# Patient Record
Sex: Male | Born: 1940 | Race: White | Hispanic: No | Marital: Married | Smoking: Never smoker
Health system: Southern US, Community
[De-identification: ages and names within clinical notes are randomized; demographics above are authoritative.]

## PROBLEM LIST (undated history)

## (undated) DIAGNOSIS — C801 Malignant (primary) neoplasm, unspecified: Secondary | ICD-10-CM

## (undated) DIAGNOSIS — N200 Calculus of kidney: Secondary | ICD-10-CM

## (undated) DIAGNOSIS — M1990 Unspecified osteoarthritis, unspecified site: Secondary | ICD-10-CM

## (undated) DIAGNOSIS — I10 Essential (primary) hypertension: Secondary | ICD-10-CM

## (undated) DIAGNOSIS — E119 Type 2 diabetes mellitus without complications: Secondary | ICD-10-CM

## (undated) DIAGNOSIS — I219 Acute myocardial infarction, unspecified: Secondary | ICD-10-CM

## (undated) DIAGNOSIS — G4730 Sleep apnea, unspecified: Secondary | ICD-10-CM

## (undated) HISTORY — DX: Sleep apnea, unspecified: G47.30

## (undated) HISTORY — DX: Unspecified osteoarthritis, unspecified site: M19.90

## (undated) HISTORY — DX: Malignant (primary) neoplasm, unspecified: C80.1

## (undated) HISTORY — DX: Essential (primary) hypertension: I10

## (undated) HISTORY — DX: Calculus of kidney: N20.0

## (undated) HISTORY — DX: Type 2 diabetes mellitus without complications: E11.9

## (undated) HISTORY — PX: CARDIAC CATHETERIZATION: SHX172

## (undated) HISTORY — PX: TUMOR EXCISION: SHX421

## (undated) HISTORY — DX: Acute myocardial infarction, unspecified: I21.9

## (undated) HISTORY — PX: TONSILLECTOMY AND ADENOIDECTOMY: SUR1326

---

## 2008-02-27 ENCOUNTER — Ambulatory Visit (HOSPITAL_COMMUNITY): Admission: RE | Admit: 2008-02-27 | Discharge: 2008-02-27 | Payer: Self-pay | Admitting: Internal Medicine

## 2008-03-29 ENCOUNTER — Ambulatory Visit (HOSPITAL_BASED_OUTPATIENT_CLINIC_OR_DEPARTMENT_OTHER): Admission: RE | Admit: 2008-03-29 | Discharge: 2008-03-29 | Payer: Self-pay | Admitting: Internal Medicine

## 2008-03-29 ENCOUNTER — Encounter: Payer: Self-pay | Admitting: Internal Medicine

## 2008-03-30 ENCOUNTER — Ambulatory Visit: Payer: Self-pay | Admitting: Internal Medicine

## 2008-04-22 ENCOUNTER — Ambulatory Visit: Payer: Self-pay | Admitting: Internal Medicine

## 2008-04-22 DIAGNOSIS — G4733 Obstructive sleep apnea (adult) (pediatric): Secondary | ICD-10-CM | POA: Insufficient documentation

## 2008-06-14 ENCOUNTER — Ambulatory Visit: Payer: Self-pay | Admitting: Internal Medicine

## 2009-01-07 ENCOUNTER — Ambulatory Visit: Payer: Self-pay | Admitting: Internal Medicine

## 2009-01-08 ENCOUNTER — Telehealth (INDEPENDENT_AMBULATORY_CARE_PROVIDER_SITE_OTHER): Payer: Self-pay | Admitting: *Deleted

## 2009-05-05 ENCOUNTER — Encounter: Payer: Self-pay | Admitting: Internal Medicine

## 2009-08-02 ENCOUNTER — Encounter: Admission: RE | Admit: 2009-08-02 | Discharge: 2009-08-02 | Payer: Self-pay | Admitting: Sports Medicine

## 2009-08-07 ENCOUNTER — Encounter: Admission: RE | Admit: 2009-08-07 | Discharge: 2009-08-07 | Payer: Self-pay | Admitting: Sports Medicine

## 2009-10-18 ENCOUNTER — Encounter: Admission: RE | Admit: 2009-10-18 | Discharge: 2009-10-18 | Payer: Self-pay | Admitting: Orthopedic Surgery

## 2009-11-05 ENCOUNTER — Encounter: Admission: RE | Admit: 2009-11-05 | Discharge: 2009-11-05 | Payer: Self-pay | Admitting: Sports Medicine

## 2009-12-08 ENCOUNTER — Encounter: Admission: RE | Admit: 2009-12-08 | Discharge: 2009-12-08 | Payer: Self-pay | Admitting: Sports Medicine

## 2010-02-08 ENCOUNTER — Encounter: Payer: Self-pay | Admitting: Sports Medicine

## 2010-02-17 NOTE — Letter (Signed)
Summary: CMN for CPAP/BIPAP / [facility]  CMN for CPAP/BIPAP / [facility]   Imported By: Lennie Odor 05/12/2009 11:03:17  _____________________________________________________________________  External Attachment:    Type:   Image     Comment:   External Document

## 2010-03-05 ENCOUNTER — Ambulatory Visit (HOSPITAL_COMMUNITY)
Admission: RE | Admit: 2010-03-05 | Discharge: 2010-03-05 | Disposition: A | Discharge status: Home / Self Care | Payer: Medicare Other | Source: Ambulatory Visit | Attending: Internal Medicine | Admitting: Internal Medicine

## 2010-03-05 ENCOUNTER — Other Ambulatory Visit: Payer: Self-pay | Admitting: Internal Medicine

## 2010-03-05 DIAGNOSIS — R1011 Right upper quadrant pain: Secondary | ICD-10-CM

## 2010-03-05 DIAGNOSIS — R52 Pain, unspecified: Secondary | ICD-10-CM

## 2010-03-05 DIAGNOSIS — K7689 Other specified diseases of liver: Secondary | ICD-10-CM | POA: Insufficient documentation

## 2010-03-05 DIAGNOSIS — R079 Chest pain, unspecified: Secondary | ICD-10-CM | POA: Insufficient documentation

## 2010-03-05 DIAGNOSIS — R110 Nausea: Secondary | ICD-10-CM

## 2010-03-05 DIAGNOSIS — R939 Diagnostic imaging inconclusive due to excess body fat of patient: Secondary | ICD-10-CM | POA: Insufficient documentation

## 2010-03-05 DIAGNOSIS — R109 Unspecified abdominal pain: Secondary | ICD-10-CM | POA: Insufficient documentation

## 2010-03-06 ENCOUNTER — Ambulatory Visit (HOSPITAL_COMMUNITY)
Admission: RE | Admit: 2010-03-06 | Discharge: 2010-03-06 | Disposition: A | Discharge status: Home / Self Care | Payer: Medicare Other | Source: Ambulatory Visit | Attending: Internal Medicine | Admitting: Internal Medicine

## 2010-03-06 DIAGNOSIS — R52 Pain, unspecified: Secondary | ICD-10-CM

## 2010-06-02 NOTE — Procedures (Signed)
[name], Cameron Carter               ACCOUNT NO.:  [id number]   MEDICAL RECORD NO.:  [id number]          PATIENT TYPE:  OUT   LOCATION:  SLEEP CENTER                 FACILITY:  [facility]   PHYSICIAN:  Clinton D. Maple Hudson, MD, FCCP, FACPDATE OF BIRTH:  10/[birth year]/41   DATE OF STUDY:  03/29/2008                            NOCTURNAL POLYSOMNOGRAM   REFERRING PHYSICIAN:  Margaretmary Bayley, M.D.   REFERRING PHYSICIAN:  Margaretmary Bayley, MD   INDICATION FOR STUDY:  Hypersomnia with sleep apnea.   EPWORTH SLEEPINESS SCORE:  9/24, BMI 40.4, weight 290 pounds, height 71  inches.  Neck 18 inches.   MEDICATIONS:  Home medications are charted and reviewed.   SLEEP ARCHITECTURE:  Split study protocol.  During the diagnostic phase,  total sleep time was 119.5 minutes with sleep efficiency 84.5%.  Stage I  was 7.9%, stage II 92.1%, stages III and REM were absent.  Sleep latency  5 minutes, awake after sleep onset 17 minutes, arousal index 70.8  indicating increased EEG arousal.  No bedtime medication was taken.   RESPIRATORY DATA:  Split study protocol.  Apnea/hypopnea index (AHI)  102.9 per hour.  A total of 205 events were scored including 53  obstructive apneas and 152 hypopneas.  Events were non positional.  CPAP  was then titrated to 16 CWP, AHI 84 per hour.  The technician then  converted to bilevel PAP and titrated to inspiratory pressure 21,  expiratory pressure 17 CWP for a AHI 0 per hour.  He wore a medium  ResMed Mirage Quattro mask with heated humidifier.   OXYGEN DATA:  Moderate snoring before CPAP with oxygen desaturation to a  nadir of 73%.  After bilevel PAP control, oxygen saturation held 90.4%  on room air.   CARDIAC DATA:  Sinus rhythm.   MOVEMENT-PARASOMNIA:  No significant movement disturbance.  Bathroom x1.   IMPRESSIONS-RECOMMENDATIONS:  1. Severe obstructive sleep apnea/hypopnea syndrome, apnea/hypopnea      index 102.9 per hour.  Most events were recorded while nonsupine.  Moderate-to-loud snoring with oxygen desaturation to a nadir of      73%.  2. Continuous positive airway pressure titration provided inadequate      control at 16 centimeters of water pressure with residual      apnea/hypopnea index of 85.7 per hour.  Successful control required      a bilevel positive airway      pressure (BiPAP), inspiratory 21/expiratory 17 centimeters of water      pressure, apnea/hypopnea index 0 per hour.  He wore a medium ResMed      Mirage Quattro mask with heated humidifier.      Clinton D. Maple Hudson, MD, [facility], FACP  Diplomate, Biomedical engineer of Sleep Medicine  Electronically Signed     CDY/MEDQ  D:  03/31/2008 12:00:37  T:  04/01/2008 03:51:35  Job:  [id number]

## 2010-10-14 DIAGNOSIS — E119 Type 2 diabetes mellitus without complications: Secondary | ICD-10-CM | POA: Insufficient documentation

## 2010-10-14 DIAGNOSIS — I2510 Atherosclerotic heart disease of native coronary artery without angina pectoris: Secondary | ICD-10-CM | POA: Insufficient documentation

## 2010-10-14 DIAGNOSIS — E785 Hyperlipidemia, unspecified: Secondary | ICD-10-CM | POA: Insufficient documentation

## 2010-10-14 DIAGNOSIS — I10 Essential (primary) hypertension: Secondary | ICD-10-CM | POA: Insufficient documentation

## 2010-12-07 ENCOUNTER — Other Ambulatory Visit: Payer: Self-pay | Admitting: Orthopedic Surgery

## 2010-12-07 DIAGNOSIS — M48061 Spinal stenosis, lumbar region without neurogenic claudication: Secondary | ICD-10-CM

## 2010-12-17 ENCOUNTER — Other Ambulatory Visit: Payer: Medicare Other

## 2011-01-19 HISTORY — PX: SHOULDER ARTHROSCOPY W/ ROTATOR CUFF REPAIR: SHX2400

## 2011-04-29 ENCOUNTER — Other Ambulatory Visit: Payer: Self-pay | Admitting: Orthopedic Surgery

## 2011-04-29 DIAGNOSIS — M25512 Pain in left shoulder: Secondary | ICD-10-CM

## 2011-05-02 ENCOUNTER — Other Ambulatory Visit: Payer: Medicare Other

## 2011-05-03 ENCOUNTER — Other Ambulatory Visit: Payer: Medicare Other

## 2011-07-15 DIAGNOSIS — G4730 Sleep apnea, unspecified: Secondary | ICD-10-CM | POA: Insufficient documentation

## 2011-07-15 DIAGNOSIS — E669 Obesity, unspecified: Secondary | ICD-10-CM | POA: Insufficient documentation

## 2011-09-07 DIAGNOSIS — M75122 Complete rotator cuff tear or rupture of left shoulder, not specified as traumatic: Secondary | ICD-10-CM | POA: Insufficient documentation

## 2012-02-08 ENCOUNTER — Other Ambulatory Visit: Payer: Self-pay | Admitting: Internal Medicine

## 2012-02-08 DIAGNOSIS — R0781 Pleurodynia: Secondary | ICD-10-CM

## 2012-02-08 DIAGNOSIS — R0789 Other chest pain: Secondary | ICD-10-CM

## 2012-02-16 ENCOUNTER — Encounter (HOSPITAL_COMMUNITY)
Admission: RE | Admit: 2012-02-16 | Discharge: 2012-02-16 | Disposition: A | Discharge status: Home / Self Care | Payer: Medicare Other | Source: Ambulatory Visit | Attending: Internal Medicine | Admitting: Internal Medicine

## 2012-02-16 DIAGNOSIS — R071 Chest pain on breathing: Secondary | ICD-10-CM | POA: Insufficient documentation

## 2012-02-16 DIAGNOSIS — M47817 Spondylosis without myelopathy or radiculopathy, lumbosacral region: Secondary | ICD-10-CM | POA: Insufficient documentation

## 2012-02-16 DIAGNOSIS — R0781 Pleurodynia: Secondary | ICD-10-CM

## 2012-02-16 DIAGNOSIS — R0789 Other chest pain: Secondary | ICD-10-CM

## 2012-02-16 DIAGNOSIS — M1710 Unilateral primary osteoarthritis, unspecified knee: Secondary | ICD-10-CM | POA: Insufficient documentation

## 2012-02-16 MED ORDER — TECHNETIUM TC 99M MEDRONATE IV KIT
26.9000 | PACK | Freq: Once | INTRAVENOUS | Status: AC | PRN
  Administered 2012-02-16: 26.9 via INTRAVENOUS

## 2012-07-12 DIAGNOSIS — M5126 Other intervertebral disc displacement, lumbar region: Secondary | ICD-10-CM | POA: Insufficient documentation

## 2012-10-05 ENCOUNTER — Other Ambulatory Visit: Payer: Self-pay | Admitting: Internal Medicine

## 2012-10-05 DIAGNOSIS — R1011 Right upper quadrant pain: Secondary | ICD-10-CM

## 2012-10-11 ENCOUNTER — Ambulatory Visit (HOSPITAL_COMMUNITY)
Admission: RE | Admit: 2012-10-11 | Discharge: 2012-10-11 | Disposition: A | Discharge status: Home / Self Care | Payer: Medicare Other | Source: Ambulatory Visit | Attending: Internal Medicine | Admitting: Internal Medicine

## 2012-10-11 DIAGNOSIS — R1011 Right upper quadrant pain: Secondary | ICD-10-CM | POA: Insufficient documentation

## 2012-10-11 DIAGNOSIS — K7689 Other specified diseases of liver: Secondary | ICD-10-CM | POA: Insufficient documentation

## 2014-01-01 ENCOUNTER — Emergency Department: Payer: Self-pay | Admitting: Emergency Medicine

## 2014-01-01 LAB — BASIC METABOLIC PANEL
Anion Gap: 7 (ref 7–16)
BUN: 16 mg/dL (ref 7–18)
Calcium, Total: 9 mg/dL (ref 8.5–10.1)
Chloride: 103 mmol/L (ref 98–107)
Co2: 26 mmol/L (ref 21–32)
Creatinine: 1.34 mg/dL — ABNORMAL HIGH (ref 0.60–1.30)
EGFR (African American): >60
EGFR (Non-African Amer.): 56 — ABNORMAL LOW
Glucose: 302 mg/dL — ABNORMAL HIGH (ref 65–99)
Osmolality: 284 (ref 275–301)
Potassium: 4.4 mmol/L (ref 3.5–5.1)
Sodium: 136 mmol/L (ref 136–145)

## 2014-01-01 LAB — CBC
HCT: 49.4 % (ref 40.0–52.0)
HGB: 15.9 g/dL (ref 13.0–18.0)
MCH: 31 pg (ref 26.0–34.0)
MCHC: 32.1 g/dL (ref 32.0–36.0)
MCV: 96 fL (ref 80–100)
Platelet: 239 10*3/uL (ref 150–440)
RBC: 5.12 10*6/uL (ref 4.40–5.90)
RDW: 13.8 % (ref 11.5–14.5)
WBC: 11.3 10*3/uL — ABNORMAL HIGH (ref 3.8–10.6)

## 2014-01-01 LAB — TROPONIN I
Troponin-I: 0.02 ng/mL
Troponin-I: 0.03 ng/mL

## 2014-01-01 LAB — PRO B NATRIURETIC PEPTIDE: B-Type Natriuretic Peptide: 695 pg/mL — ABNORMAL HIGH (ref 0–125)

## 2014-06-03 DIAGNOSIS — R2681 Unsteadiness on feet: Secondary | ICD-10-CM | POA: Insufficient documentation

## 2014-07-05 ENCOUNTER — Emergency Department
Admission: EM | Admit: 2014-07-05 | Discharge: 2014-07-05 | Disposition: A | Discharge status: Home / Self Care | Payer: Medicare Other | Attending: Emergency Medicine | Admitting: Emergency Medicine

## 2014-07-05 ENCOUNTER — Encounter: Payer: Self-pay | Admitting: Emergency Medicine

## 2014-07-05 ENCOUNTER — Emergency Department: Payer: Medicare Other

## 2014-07-05 DIAGNOSIS — S199XXA Unspecified injury of neck, initial encounter: Secondary | ICD-10-CM | POA: Insufficient documentation

## 2014-07-05 DIAGNOSIS — S0990XA Unspecified injury of head, initial encounter: Secondary | ICD-10-CM

## 2014-07-05 DIAGNOSIS — Y998 Other external cause status: Secondary | ICD-10-CM | POA: Diagnosis not present

## 2014-07-05 DIAGNOSIS — W07XXXA Fall from chair, initial encounter: Secondary | ICD-10-CM | POA: Diagnosis not present

## 2014-07-05 DIAGNOSIS — Y9289 Other specified places as the place of occurrence of the external cause: Secondary | ICD-10-CM | POA: Diagnosis not present

## 2014-07-05 DIAGNOSIS — Y9389 Activity, other specified: Secondary | ICD-10-CM | POA: Diagnosis not present

## 2014-07-05 DIAGNOSIS — S0101XA Laceration without foreign body of scalp, initial encounter: Secondary | ICD-10-CM | POA: Insufficient documentation

## 2014-07-05 DIAGNOSIS — IMO0002 Reserved for concepts with insufficient information to code with codable children: Secondary | ICD-10-CM

## 2014-07-05 DIAGNOSIS — S29092A Other injury of muscle and tendon of back wall of thorax, initial encounter: Secondary | ICD-10-CM | POA: Diagnosis not present

## 2014-07-05 MED ORDER — OXYCODONE-ACETAMINOPHEN 5-325 MG PO TABS: 1.0000 | ORAL_TABLET | Freq: Four times a day (QID) | ORAL | Status: DC | PRN

## 2014-07-05 MED ORDER — OXYCODONE-ACETAMINOPHEN 5-325 MG PO TABS
1.0000 | ORAL_TABLET | Freq: Once | ORAL | Status: AC
  Administered 2014-07-05: 1 via ORAL

## 2014-07-05 MED ORDER — OXYCODONE-ACETAMINOPHEN 5-325 MG PO TABS
ORAL_TABLET | ORAL | Status: AC
  Administered 2014-07-05: 1 via ORAL
  Filled 2014-07-05: qty 1

## 2014-07-05 MED ORDER — LIDOCAINE-EPINEPHRINE (PF) 1 %-1:200000 IJ SOLN
INTRAMUSCULAR | Status: AC
  Administered 2014-07-05: 30 mL
  Filled 2014-07-05: qty 30

## 2014-07-05 MED ORDER — SILVER NITRATE-POT NITRATE 75-25 % EX MISC
CUTANEOUS | Status: AC
  Filled 2014-07-05: qty 1

## 2014-07-05 NOTE — ED Notes (Signed)
Pt arrives via ems from home after falling backwards out of his chair when the leg broke, pt fell backwards hitting his head and upper back on the car lift in his garage. Pt is on a "blood thinner", lac to the back of his head, pt is boarded with c-collar in place on arrival

## 2014-07-05 NOTE — Discharge Instructions (Signed)
Laceration Care, Adult A laceration is a cut that goes through all layers of the skin. The cut goes into the tissue beneath the skin. HOME CARE For stitches (sutures) or staples:  Keep the cut clean and dry.  If you have a bandage (dressing), change it at least once a day. Change the bandage if it gets wet or dirty, or as told by your doctor.  Wash the cut with soap and water 2 times a day. Rinse the cut with water. Pat it dry with a clean towel.  Put a thin layer of medicated cream on the cut as told by your doctor.  You may shower after the first 24 hours. Do not soak the cut in water until the stitches are removed.  Only take medicines as told by your doctor.  Have your stitches or staples removed as told by your doctor. For skin adhesive strips:  Keep the cut clean and dry.  Do not get the strips wet. You may take a bath, but be careful to keep the cut dry.  If the cut gets wet, pat it dry with a clean towel.  The strips will fall off on their own. Do not remove the strips that are still stuck to the cut. For wound glue:  You may shower or take baths. Do not soak or scrub the cut. Do not swim. Avoid heavy sweating until the glue falls off on its own. After a shower or bath, pat the cut dry with a clean towel.  Do not put medicine on your cut until the glue falls off.  If you have a bandage, do not put tape over the glue.  Avoid lots of sunlight or tanning lamps until the glue falls off. Put sunscreen on the cut for the first year to reduce your scar.  The glue will fall off on its own. Do not pick at the glue. You may need a tetanus shot if:  You cannot remember when you had your last tetanus shot.  You have never had a tetanus shot. If you need a tetanus shot and you choose not to have one, you may get tetanus. Sickness from tetanus can be serious. GET HELP RIGHT AWAY IF:   Your pain does not get better with medicine.  Your arm, hand, leg, or foot loses feeling  (numbness) or changes color.  Your cut is bleeding.  Your joint feels weak, or you cannot use your joint.  You have painful lumps on your body.  Your cut is red, puffy (swollen), or painful.  You have a red line on the skin near the cut.  You have yellowish-white fluid (pus) coming from the cut.  You have a fever.  You have a bad smell coming from the cut or bandage.  Your cut breaks open before or after stitches are removed.  You notice something coming out of the cut, such as wood or glass.  You cannot move a finger or toe. MAKE SURE YOU:   Understand these instructions.  Will watch your condition.  Will get help right away if you are not doing well or get worse. Document Released: 06/23/2007 Document Revised: 03/29/2011 Document Reviewed: 06/30/2010 [facility] Patient Information 2015 [place], [place]. This information is not intended to replace advice given to you by your health care provider. Make sure you discuss any questions you have with your health care provider.     You have been seen in the emergency department today for a fall. You have suffered  a laceration to the back of your head. This has been repaired with staples which will need to be removed in 10-14 days. Please follow-up with your doctor or return to the emergency department for staple removal. You may wash her hair, but do not wipe dry, instead use a blow dryer or pat dry. Keep the area covered with a small amount of Neosporin to help prevent infection. Return to the emergency department for any sudden/severe headache, confusion, slurred speech, weakness or numbness of any arm or leg, or any other symptom personally concerning to your self. Please take your pain medication as needed, but only as prescribed.

## 2014-07-05 NOTE — ED Provider Notes (Signed)
-----------------------------------------   5:14 PM on 07/05/2014 -----------------------------------------  Patient calm and comfortable pain control. Wound is hemostatic. Workup unremarkable no significant injuries on imaging. We'll discharge home and have patient follow up with primary care. Patient counseled to have staples removed in about a week.  Carrie Mew, MD 07/05/14 1714

## 2014-07-05 NOTE — ED Provider Notes (Signed)
[facility] Emergency Department Provider Note  Time seen: 2:26 PM  I have reviewed the triage vital signs and the nursing notes.   HISTORY  Chief Complaint Fall    HPI Cameron Carter is a 74 y.o. male who presents the emergency department after mechanical fall. According to the patient he was sitting in a plastic chair, when the back legs broke and the patient fell backwards hitting his neck and back of head.Family who witnessed the event denies loss of consciousness. Patient states 2/10 pain currently. Patient had considerable bleeding from the occipital scalp upon arrival. Patient takes Plavix and aspirin at home. Patient describes his pain as mostly in his head and the back of his neck. Arrived on a backboard with a c-collar precaution.     No past medical history on file.  Patient Active Problem List   Diagnosis Date Noted  . SLEEP APNEA, OBSTRUCTIVE 04/22/2008    No past surgical history on file.  No current outpatient prescriptions on file.  Allergies Review of patient's allergies indicates no known allergies.  No family history on file.  Social History History  Substance Use Topics  . Smoking status: Never Smoker   . Smokeless tobacco: Not on file  . Alcohol Use: No    Review of Systems Constitutional: Negative for fever. Cardiovascular: Negative for chest pain. Respiratory: Negative for shortness of breath. Gastrointestinal: Negative for abdominal pain Musculoskeletal: Positive for mid back pain Neurological: Other than for pain to the back and head. Negative for any focal weakness or numbness.  10-point ROS otherwise negative.  ____________________________________________   PHYSICAL EXAM:  VITAL SIGNS: ED Triage Vitals  Enc Vitals Group     BP 07/05/14 1406 154/94 mmHg     Pulse Rate 07/05/14 1406 88     Resp --      Temp 07/05/14 1406 97.6 F (36.4 C)     Temp Source 07/05/14 1406 Oral     SpO2 --      Weight --       Height --      Head Cir --      Peak Flow --      Pain Score 07/05/14 1409 1     Pain Loc --      Pain Edu? --      Excl. in [place]? --     Constitutional: Alert and oriented. Well appearing  ENT   Head: Patient with 6 cm laceration to the occipital scalp, with small active arterial pulsation. Cardiovascular: Normal rate, regular rhythm. No murmur Respiratory: Normal respiratory effort without tachypnea nor retractions. Breath sounds are clear  Gastrointestinal: Soft and nontender. No distention.   Musculoskeletal: Nontender with normal range of motion in all extremities. Mild mid back (lower T-spine/upper L-spine) tenderness. Neurologic:  Normal speech and language. No gross focal neurologic deficits are appreciated. Skin:  Skin is warm, dry and intact, other than as stated above. Psychiatric: Mood and affect are normal. Speech and behavior are normal. ____________________________________________   RADIOLOGY  No acute fractures identified. No intracranial concerns identified.  ____________________________________________    INITIAL IMPRESSION / ASSESSMENT AND PLAN / ED COURSE  Pertinent labs & imaging results that were available during my care of the patient were reviewed by me and considered in my medical decision making (see chart for details).  Patient with mechanical fall, no loss of consciousness. 6 cm laceration repaired with staples. We will obtain a CT head, cervical spine, x-rays of his thoracic and  lumbar spine.  LACERATION REPAIR Performed by: Cameron Carter Authorized by: Cameron Carter Consent: Verbal consent obtained. Risks and benefits: risks, benefits and alternatives were discussed Consent given by: patient Patient identity confirmed: provided demographic data Prepped and Draped in normal sterile fashion Wound explored  Laceration Location: Occipital scalp  Laceration Length: 6 cm  No Foreign Bodies seen or palpated  Anesthesia: local  infiltration  Local anesthetic: lidocaine 1% with epinephrine  Anesthetic total: 10 ml  Irrigation method: syringe Amount of cleaning: standard  Skin closure: Staples   Number of staples: 4   Technique: Interrupted staples   Patient tolerance: Patient tolerated the procedure well with no immediate complications. Hemostasis achieved after injection with lidocaine with epinephrine, and stapling.  Imaging within normal limits and the patient was discharged home.   ____________________________________________   FINAL CLINICAL IMPRESSION(S) / ED DIAGNOSES  Scalp laceration Head injury Neck pain   Cameron Dark, MD 07/07/14 0930

## 2014-07-15 ENCOUNTER — Emergency Department
Admission: EM | Admit: 2014-07-15 | Discharge: 2014-07-15 | Disposition: A | Discharge status: Home / Self Care | Payer: Medicare Other | Attending: Student | Admitting: Student

## 2014-07-15 ENCOUNTER — Encounter: Payer: Self-pay | Admitting: Emergency Medicine

## 2014-07-15 DIAGNOSIS — IMO0002 Reserved for concepts with insufficient information to code with codable children: Secondary | ICD-10-CM

## 2014-07-15 DIAGNOSIS — Z4802 Encounter for removal of sutures: Secondary | ICD-10-CM | POA: Diagnosis present

## 2014-07-15 DIAGNOSIS — Z4801 Encounter for change or removal of surgical wound dressing: Secondary | ICD-10-CM | POA: Diagnosis not present

## 2014-07-15 NOTE — ED Provider Notes (Signed)
[facility] Emergency Department Provider Note  ____________________________________________  Time seen: Approximately 7:20 AM  I have reviewed the triage vital signs and the nursing notes.   HISTORY  Chief Complaint Suture / Staple Removal    HPI Cameron Carter is a 74 y.o. male is here today for evaluation for staple removal from back to scalp 4 staples placed from fall a little over a week ago denies any complicationsissues or pain is here today just for the staple removal no other complaints at this time   History reviewed. No pertinent past medical history.  Patient Active Problem List   Diagnosis Date Noted  . SLEEP APNEA, OBSTRUCTIVE 04/22/2008    History reviewed. No pertinent past surgical history.  Current Outpatient Rx  Name  Route  Sig  Dispense  Refill  . oxyCODONE-acetaminophen (ROXICET) 5-325 MG per tablet   Oral   Take 1 tablet by mouth every 6 (six) hours as needed.   12 tablet   0     Allergies Review of patient's allergies indicates no known allergies.  No family history on file.  Social History History  Substance Use Topics  . Smoking status: Never Smoker   . Smokeless tobacco: Not on file  . Alcohol Use: No    Review of Systems Constitutional: No fever/chills Eyes: No visual changes. ENT: No sore throat. Cardiovascular: Denies chest pain. Respiratory: Denies shortness of breath. Gastrointestinal: No abdominal pain.  No nausea, no vomiting.  No diarrhea.  No constipation. Genitourinary: Negative for dysuria. Musculoskeletal: Negative for back pain. Skin: Negative for rash. Neurological: Negative for headaches, focal weakness or numbness.  10-point ROS otherwise negative.  ____________________________________________   PHYSICAL EXAM:  VITAL SIGNS: ED Triage Vitals  Enc Vitals Group     BP 07/15/14 0724 157/83 mmHg     Pulse Rate 07/15/14 0724 93     Resp 07/15/14 0724 18     Temp 07/15/14 0724 98.9  F (37.2 C)     Temp Source 07/15/14 0724 Oral     SpO2 07/15/14 0724 96 %     Weight 07/15/14 0724 272 lb (123.378 kg)     Height 07/15/14 0724 5\' 10"  (1.778 m)     Head Cir --      Peak Flow --      Pain Score --      Pain Loc --      Pain Edu? --      Excl. in [place]? --     Constitutional: Alert and oriented. Well appearing and in no acute distress. Eyes: Conjunctivae are normal. PERRL. EOMI. Head: Atraumatic. 4 surgical staples placed and intact in the back of the scalp occipital area Neck: No stridor.   Cardiovascular: Normal rate, regular rhythm. Grossly normal heart sounds.  Good peripheral circulation. Respiratory: Normal respiratory effort.  No retractions. Lungs CTAB. Neurologic:  Normal speech and language. No gross focal neurologic deficits are appreciated. Speech is normal. No gait instability. Skin:  Skin is warm, dry and intact. No rash noted. Psychiatric: Mood and affect are normal. Speech and behavior are normal.    PROCEDURES  Procedure(s) performed: 4 staples were removed by me from the back of the patient's scalp without complication wound appeared well-healed  Critical Care performed: No  ____________________________________________   INITIAL IMPRESSION / ASSESSMENT AND PLAN / ED COURSE  Pertinent labs & imaging results that were available during my care of the patient were reviewed by me and considered in my medical decision  making (see chart for details).  Wound recheck staple removal wound appeared well-healed no sign of dehiscence or infection patient was given instructions for wound care follow-up. As needed ____________________________________________   FINAL CLINICAL IMPRESSION(S) / ED DIAGNOSES  Final diagnoses:  Encounter for re-check of laceration wound  Removal of staple      Aspen Lawrance Verdene Rio, PA-C 07/15/14 2863  Joanne Gavel, MD 07/15/14 1527

## 2014-07-15 NOTE — ED Notes (Signed)
Here for staple removal

## 2014-08-28 ENCOUNTER — Other Ambulatory Visit: Payer: Self-pay | Admitting: Internal Medicine

## 2014-08-28 DIAGNOSIS — M542 Cervicalgia: Secondary | ICD-10-CM

## 2014-08-28 DIAGNOSIS — G4489 Other headache syndrome: Secondary | ICD-10-CM

## 2014-09-11 ENCOUNTER — Ambulatory Visit
Admission: RE | Admit: 2014-09-11 | Discharge: 2014-09-11 | Disposition: A | Discharge status: Home / Self Care | Payer: Medicare Other | Source: Ambulatory Visit | Attending: Internal Medicine | Admitting: Internal Medicine

## 2014-09-11 ENCOUNTER — Other Ambulatory Visit: Payer: Self-pay | Admitting: Internal Medicine

## 2014-09-11 DIAGNOSIS — M542 Cervicalgia: Secondary | ICD-10-CM

## 2014-09-11 DIAGNOSIS — G4489 Other headache syndrome: Secondary | ICD-10-CM

## 2014-12-09 DIAGNOSIS — R296 Repeated falls: Secondary | ICD-10-CM | POA: Insufficient documentation

## 2014-12-23 ENCOUNTER — Encounter: Payer: Self-pay | Admitting: Family Medicine

## 2014-12-23 ENCOUNTER — Ambulatory Visit (INDEPENDENT_AMBULATORY_CARE_PROVIDER_SITE_OTHER): Payer: Medicare Other | Admitting: Family Medicine

## 2014-12-23 VITALS — BP 152/89 | HR 94 | Ht 69.0 in | Wt 272.0 lb

## 2014-12-23 DIAGNOSIS — S3992XA Unspecified injury of lower back, initial encounter: Secondary | ICD-10-CM | POA: Diagnosis present

## 2014-12-23 MED ORDER — PREDNISONE 10 MG PO TABS: ORAL_TABLET | ORAL | Status: DC

## 2014-12-23 MED ORDER — DIAZEPAM 5 MG PO TABS: ORAL_TABLET | ORAL | Status: DC

## 2014-12-23 NOTE — Patient Instructions (Signed)
Take prednisone as directed. Take valium before the procedure (you can repeat if needed) I will call you the business day following the MRI to go over results and likely to set up an injection.

## 2014-12-24 ENCOUNTER — Ambulatory Visit
Admission: RE | Admit: 2014-12-24 | Discharge: 2014-12-24 | Disposition: A | Discharge status: Home / Self Care | Payer: Medicare Other | Source: Ambulatory Visit | Attending: Family Medicine | Admitting: Family Medicine

## 2014-12-24 ENCOUNTER — Other Ambulatory Visit: Payer: Self-pay | Admitting: Family Medicine

## 2014-12-24 DIAGNOSIS — M5416 Radiculopathy, lumbar region: Secondary | ICD-10-CM

## 2014-12-25 DIAGNOSIS — S3992XA Unspecified injury of lower back, initial encounter: Secondary | ICD-10-CM | POA: Insufficient documentation

## 2014-12-25 NOTE — Progress Notes (Signed)
PCP: Foye Spurling, MD  Subjective:   HPI: Patient is a 74 y.o. male here for low back pain.  Patient has prior history of low back problems. About 4 years ago had pain similar to current pain radiating into leg - improved with ESIs. Current pain started about 1 month ago - left side of low back radiating into left leg. He recalls when this started he fell into a corner of a countertop with low back. Tingling into left foot. Pain level is 9/10, very uncomfortable. Worse with any motions of low back. No bowel/bladder dysfunction. Went to an urgent care - x-rays negative for fracture.  Given norco which has not helped but just made him constipated.  No past medical history on file.  No current outpatient prescriptions on file prior to visit.   No current facility-administered medications on file prior to visit.    No past surgical history on file.  Allergies  Allergen Reactions  . Atorvastatin     Other reaction(s): Muscle Pain    Social History   Social History  . Marital Status: Married    Spouse Name: N/A  . Number of Children: N/A  . Years of Education: N/A   Occupational History  . Not on file.   Social History Main Topics  . Smoking status: Never Smoker   . Smokeless tobacco: Not on file  . Alcohol Use: No  . Drug Use: Not on file  . Sexual Activity: Not on file   Other Topics Concern  . Not on file   Social History Narrative    No family history on file.  BP 152/89 mmHg  Pulse 94  Ht 5\' 9"  (1.753 m)  Wt 272 lb (123.378 kg)  BMI 40.15 kg/m2  Review of Systems: See HPI above.    Objective:  Physical Exam:  Gen: NAD, uncomfortable though in a wheelchair in exam room.  Back: No gross deformity, scoliosis. TTP midline and left > right paraspinal regions lumbar spine.   Very limited flexion and extension, both painful. Strength LEs 5/5 all muscle groups.   Mild positive SLR on left, negative right. Sensation diminished lateral lower leg  on left only.  Bilateral hips: Negative logrolls    Assessment & Plan:  1. Low back injury - initial radiographs negative though I was unable to review these.  Concerning for severe radiculopathy from new disc herniation and likely underlying DDD.  Will go ahead with MRI to further assess.  Prednisone dose pack in meantime.    Addendum:  MRI reviewed and discussed with patient.  He has a new disc fragment at L1-2 on right appears to compress right L2 nerve but no symptoms into right leg.  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  Discussed his options - he has not improved with prednisone to date and would like to try ESI.  Will request consideration from IR be given to drainage of synovial cyst if possible as well though only 3x73mm.

## 2014-12-25 NOTE — Assessment & Plan Note (Signed)
initial radiographs negative though I was unable to review these.  Concerning for severe radiculopathy from new disc herniation and likely underlying DDD.  Will go ahead with MRI to further assess.  Prednisone dose pack in meantime.

## 2014-12-26 ENCOUNTER — Other Ambulatory Visit: Payer: Self-pay | Admitting: Family Medicine

## 2014-12-26 ENCOUNTER — Ambulatory Visit
Admission: RE | Admit: 2014-12-26 | Discharge: 2014-12-26 | Disposition: A | Discharge status: Home / Self Care | Payer: Medicare Other | Source: Ambulatory Visit | Attending: Family Medicine | Admitting: Family Medicine

## 2014-12-26 DIAGNOSIS — M5416 Radiculopathy, lumbar region: Secondary | ICD-10-CM

## 2014-12-26 MED ORDER — DIAZEPAM 5 MG PO TABS
5.0000 mg | ORAL_TABLET | Freq: Once | ORAL | Status: AC
  Administered 2014-12-26: 5 mg via ORAL

## 2014-12-26 MED ORDER — IOHEXOL 180 MG/ML  SOLN
1.0000 mL | Freq: Once | INTRAMUSCULAR | Status: AC | PRN
  Administered 2014-12-26: 1 mL via EPIDURAL

## 2014-12-26 MED ORDER — METHYLPREDNISOLONE ACETATE 40 MG/ML INJ SUSP (RADIOLOG
120.0000 mg | Freq: Once | INTRAMUSCULAR | Status: AC
  Administered 2014-12-26: 120 mg via EPIDURAL

## 2014-12-26 NOTE — Discharge Instructions (Signed)

## 2014-12-30 ENCOUNTER — Inpatient Hospital Stay: Admission: RE | Admit: 2014-12-30 | Payer: Medicare Other | Source: Ambulatory Visit

## 2014-12-30 ENCOUNTER — Other Ambulatory Visit: Payer: Medicare Other

## 2014-12-31 ENCOUNTER — Ambulatory Visit: Payer: Medicare Other | Admitting: Sports Medicine

## 2015-01-22 ENCOUNTER — Telehealth: Payer: Self-pay | Admitting: Family Medicine

## 2015-01-22 NOTE — Telephone Encounter (Signed)
We can give him a script for the walker.  If the shot definitely helped him you can repeat this up to 2 more times - sometimes people need multiple shots to get back to normal.  We could put an order in for this.  If he'd prefer I examine him before this (especially if pain is different) I can do so as well.

## 2015-01-22 NOTE — Telephone Encounter (Signed)
Spoke to patient and gave him information provided by physician. Wanted script for walker mailed. Will call next week to let us know what he would like to do.

## 2015-02-12 ENCOUNTER — Ambulatory Visit (INDEPENDENT_AMBULATORY_CARE_PROVIDER_SITE_OTHER): Payer: Medicare Other | Admitting: Family Medicine

## 2015-02-12 ENCOUNTER — Encounter: Payer: Self-pay | Admitting: Family Medicine

## 2015-02-12 ENCOUNTER — Other Ambulatory Visit: Payer: Self-pay | Admitting: Family Medicine

## 2015-02-12 VITALS — BP 136/81 | HR 99 | Ht 70.0 in | Wt 270.0 lb

## 2015-02-12 DIAGNOSIS — M7130 Other bursal cyst, unspecified site: Secondary | ICD-10-CM

## 2015-02-12 DIAGNOSIS — S3992XA Unspecified injury of lower back, initial encounter: Secondary | ICD-10-CM | POA: Diagnosis present

## 2015-02-12 DIAGNOSIS — G8929 Other chronic pain: Secondary | ICD-10-CM

## 2015-02-12 DIAGNOSIS — M545 Low back pain: Principal | ICD-10-CM

## 2015-02-12 MED ORDER — HYDROCODONE-ACETAMINOPHEN 5-325 MG PO TABS: 1.0000 | ORAL_TABLET | Freq: Four times a day (QID) | ORAL | Status: DC | PRN

## 2015-02-12 MED ORDER — DIAZEPAM 5 MG PO TABS: ORAL_TABLET | ORAL | Status: DC

## 2015-02-12 NOTE — Patient Instructions (Signed)
It's ok to take a valium before the injection. Take norco as needed for severe pain. We are arranging for aspiration and injection on this side of your lumbar spine. Call me in 1 week to let me know how you're doing. Let me know if you want to try a different nerve blocking medicine. It's ok to hold the physical therapy at this point. If the shots aren't helping we may need to have you consult with the neurosurgeon.

## 2015-02-13 ENCOUNTER — Ambulatory Visit
Admission: RE | Admit: 2015-02-13 | Discharge: 2015-02-13 | Disposition: A | Discharge status: Home / Self Care | Payer: Medicare Other | Source: Ambulatory Visit | Attending: Family Medicine | Admitting: Family Medicine

## 2015-02-13 DIAGNOSIS — M545 Low back pain: Principal | ICD-10-CM

## 2015-02-13 DIAGNOSIS — G8929 Other chronic pain: Secondary | ICD-10-CM

## 2015-02-13 DIAGNOSIS — M7130 Other bursal cyst, unspecified site: Secondary | ICD-10-CM

## 2015-02-13 MED ORDER — METHYLPREDNISOLONE ACETATE 40 MG/ML INJ SUSP (RADIOLOG
120.0000 mg | Freq: Once | INTRAMUSCULAR | Status: AC
  Administered 2015-02-13: 120 mg via EPIDURAL

## 2015-02-13 MED ORDER — IOHEXOL 180 MG/ML  SOLN
1.0000 mL | Freq: Once | INTRAMUSCULAR | Status: AC | PRN
  Administered 2015-02-13: 1 mL via EPIDURAL

## 2015-02-13 NOTE — Assessment & Plan Note (Signed)
initial radiographs negative though I was unable to review these.  MRI of his lumbar spine from December showed disc fragment L1-2 on right though he has no right leg symptoms.  He has a progression of a synovial cyst measuring 3x36mm on the left that appears to be compression left L2 nerve root.  He had a trial of ESI at L3-4 on left without improvement after discussion with Dr. Jobe Igo who performed this - from discussion based on initial symptoms he was to try this first and if not improving then could come back to try aspiration of the synovial cyst with steroid injection at the level of L2 radiculopathy.  Discussed with patient and we will arrange for this procedure at [place].  Valium prior to injection.  Norco as needed for severe pain.  Consider neurosurgery, pain management referral, 3rd ESI depending how he's doing following this.

## 2015-02-13 NOTE — Progress Notes (Signed)
PCP: Foye Spurling, MD  Subjective:   HPI: Patient is a 75 y.o. male here for low back pain.  12/23/14: Patient has prior history of low back problems. About 4 years ago had pain similar to current pain radiating into leg - improved with ESIs. Current pain started about 1 month ago - left side of low back radiating into left leg. He recalls when this started he fell into a corner of a countertop with low back. Tingling into left foot. Pain level is 9/10, very uncomfortable. Worse with any motions of low back. No bowel/bladder dysfunction. Went to an urgent care - x-rays negative for fracture.  Given norco which has not helped but just made him constipated.  02/12/15: Patient returns with persistent left sided low back pain. Pain is severe and sharp - 6/10 at rest up to 9/10 this morning though. Pain worse with sitting. Has done some physical therapy but unable to tolerate much of this. Pain radiates around to left hip mainly. Isolated left calf pain but does not radiate from back into the calf. Has to use walker. Using TENS unit also. ESI with minimal improvement - after discussion with interventional radiologist decision was made to try ESI at L3-4 on left first and if not improved then consider higher where MRI suggests L2 radiculopathy.  No past medical history on file.  Current Outpatient Prescriptions on File Prior to Visit  Medication Sig Dispense Refill  . carvedilol (COREG) 12.5 MG tablet   10  . clopidogrel (PLAVIX) 75 MG tablet   2  . gabapentin (NEURONTIN) 300 MG capsule   9  . glimepiride (AMARYL) 4 MG tablet   98  . HUMALOG MIX 75/25 KWIKPEN (75-25) 100 UNIT/ML Kwikpen   98  . isosorbide mononitrate (IMDUR) 30 MG 24 hr tablet   10  . nitroGLYCERIN (NITROSTAT) 0.4 MG SL tablet Prn for CP as instructed.    . pravastatin (PRAVACHOL) 40 MG tablet   2  . predniSONE (DELTASONE) 10 MG tablet 6 tabs po day 1, 5 tabs po day 2, 4 tabs po day 3, 3 tabs po day 4, 2 tabs po  day 5, 1 tab po day 6 21 tablet 0   No current facility-administered medications on file prior to visit.    No past surgical history on file.  Allergies  Allergen Reactions  . Atorvastatin     Other reaction(s): Muscle Pain    Social History   Social History  . Marital Status: Married    Spouse Name: N/A  . Number of Children: N/A  . Years of Education: N/A   Occupational History  . Not on file.   Social History Main Topics  . Smoking status: Never Smoker   . Smokeless tobacco: Not on file  . Alcohol Use: No  . Drug Use: Not on file  . Sexual Activity: Not on file   Other Topics Concern  . Not on file   Social History Narrative    No family history on file.  BP 136/81 mmHg  Pulse 99  Ht 5\' 10"  (1.778 m)  Wt 270 lb (122.471 kg)  BMI 38.74 kg/m2  Review of Systems: See HPI above.    Objective:  Physical Exam:  Gen: NAD, uncomfortable though in a wheelchair in exam room.  Back: No gross deformity, scoliosis. TTP left upper lumbar paraspinal region.  No midline, right paraspinal tenderness.  No upper leg tenderness.  Both medial gastrocs and lateral gastrocs tender though. Very limited flexion  and extension, both painful. Strength LEs 5/5 all muscle groups.   Mild positive SLR on left, negative right. Sensation diminished lateral lower leg on left only.  Bilateral hips: Negative logrolls    Assessment & Plan:  1. Low back injury - initial radiographs negative though I was unable to review these.  MRI of his lumbar spine from December showed disc fragment L1-2 on right though he has no right leg symptoms.  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  He had a trial of ESI at L3-4 on left without improvement after discussion with Dr. Jobe Igo who performed this - from discussion based on initial symptoms he was to try this first and if not improving then could come back to try  aspiration of the synovial cyst with steroid injection at the level of L2 radiculopathy.  Discussed with patient and we will arrange for this procedure at [facility].  Valium prior to injection.  Norco as needed for severe pain.  Consider neurosurgery, pain management referral, 3rd ESI depending how he's doing following this.

## 2015-02-19 ENCOUNTER — Telehealth: Payer: Self-pay | Admitting: Family Medicine

## 2015-02-19 NOTE — Telephone Encounter (Signed)
I don't think a third injection would be worthwhile.  And his pain is pretty severe that I doubt the nerve blocking medications would provide benefit.  I really think he should at least consult with a neurosurgeon to see what they think.  Otherwise I think we've exhausted our conservative options.

## 2015-02-20 NOTE — Telephone Encounter (Signed)
Spoke with patient and sent referral to neurosurgery.

## 2015-03-05 ENCOUNTER — Telehealth: Payer: Self-pay | Admitting: Family Medicine

## 2015-03-07 NOTE — Telephone Encounter (Signed)
Spoke to patient and he stated that he was going to hold off on seeing pain management at this time.

## 2015-04-09 ENCOUNTER — Telehealth: Payer: Self-pay | Admitting: Family Medicine

## 2015-04-09 NOTE — Telephone Encounter (Signed)
It might be difficult to get him in to a different pain management physician but I'm ok with referring him somewhere else.  I don't mind giving him a one time script for a pain medication but last time we saw him he said he was reluctant to take anything like this.

## 2015-04-09 NOTE — Telephone Encounter (Signed)
He saw Dr. Hal Neer who refered him to pain mgmt.  He did not like how he was treated in the pain mgmt (basically same office).  he was in too much pain to sit and fill out cumbersome paperwork.  He wants to see a different doctor now if you can suggest?    (He was fine with Dr. Hal Neer, but not with the lengthy, double paperwork). Dr. Hal Neer said he was 'inoperable' which very much concerns him.  He needs something for the pain.  Please call patient.

## 2015-04-10 MED ORDER — HYDROCODONE-ACETAMINOPHEN 5-325 MG PO TABS: 1.0000 | ORAL_TABLET | Freq: Four times a day (QID) | ORAL | Status: AC | PRN

## 2015-04-11 MED FILL — HYDROCODON-APAP 5-325: 5-325 | 10 days supply | Qty: 40 | Fill #0

## 2015-04-30 ENCOUNTER — Telehealth: Payer: Self-pay | Admitting: Pain Medicine

## 2015-04-30 NOTE — Telephone Encounter (Signed)
Please discuss to scheduled with me today

## 2015-04-30 NOTE — Telephone Encounter (Signed)
Hurting very badly and would like to come in sooner than 4-20 if possible, please ask Dr. Primus Bravo if he can be worked in sooner

## 2015-05-06 ENCOUNTER — Telehealth: Payer: Self-pay | Admitting: Internal Medicine

## 2015-05-06 NOTE — Telephone Encounter (Signed)
Called and spoke with pt. I explained to him that he has not been seen 2011 and that he will need a new consult visit in order for [place] to place an order for a new CPAP. I scheduled him for a consult on 05/27/15 with JN. He voiced understanding and had no further questions. Nothing further needed.

## 2015-05-08 ENCOUNTER — Ambulatory Visit: Payer: Medicare Other | Attending: Pain Medicine | Admitting: Pain Medicine

## 2015-05-08 ENCOUNTER — Encounter: Payer: Self-pay | Admitting: Pain Medicine

## 2015-05-08 ENCOUNTER — Telehealth: Payer: Self-pay | Admitting: Family Medicine

## 2015-05-08 VITALS — BP 154/96 | HR 50 | Temp 98.1°F | Resp 20 | Ht 70.0 in | Wt 270.0 lb

## 2015-05-08 DIAGNOSIS — I252 Old myocardial infarction: Secondary | ICD-10-CM | POA: Diagnosis not present

## 2015-05-08 DIAGNOSIS — M5124 Other intervertebral disc displacement, thoracic region: Secondary | ICD-10-CM | POA: Insufficient documentation

## 2015-05-08 DIAGNOSIS — M79605 Pain in left leg: Secondary | ICD-10-CM | POA: Diagnosis present

## 2015-05-08 DIAGNOSIS — M4806 Spinal stenosis, lumbar region: Secondary | ICD-10-CM | POA: Diagnosis not present

## 2015-05-08 DIAGNOSIS — M545 Low back pain: Secondary | ICD-10-CM | POA: Diagnosis present

## 2015-05-08 DIAGNOSIS — M2578 Osteophyte, vertebrae: Secondary | ICD-10-CM | POA: Diagnosis not present

## 2015-05-08 DIAGNOSIS — M5136 Other intervertebral disc degeneration, lumbar region: Secondary | ICD-10-CM | POA: Insufficient documentation

## 2015-05-08 DIAGNOSIS — I2510 Atherosclerotic heart disease of native coronary artery without angina pectoris: Secondary | ICD-10-CM | POA: Insufficient documentation

## 2015-05-08 DIAGNOSIS — M48062 Spinal stenosis, lumbar region with neurogenic claudication: Secondary | ICD-10-CM | POA: Insufficient documentation

## 2015-05-08 DIAGNOSIS — G968 Other specified disorders of central nervous system: Secondary | ICD-10-CM | POA: Insufficient documentation

## 2015-05-08 DIAGNOSIS — G4730 Sleep apnea, unspecified: Secondary | ICD-10-CM | POA: Diagnosis not present

## 2015-05-08 DIAGNOSIS — M47816 Spondylosis without myelopathy or radiculopathy, lumbar region: Secondary | ICD-10-CM

## 2015-05-08 DIAGNOSIS — E669 Obesity, unspecified: Secondary | ICD-10-CM | POA: Diagnosis not present

## 2015-05-08 DIAGNOSIS — M5416 Radiculopathy, lumbar region: Secondary | ICD-10-CM | POA: Insufficient documentation

## 2015-05-08 DIAGNOSIS — M51369 Other intervertebral disc degeneration, lumbar region without mention of lumbar back pain or lower extremity pain: Secondary | ICD-10-CM | POA: Insufficient documentation

## 2015-05-08 DIAGNOSIS — M79604 Pain in right leg: Secondary | ICD-10-CM | POA: Diagnosis present

## 2015-05-08 DIAGNOSIS — E1140 Type 2 diabetes mellitus with diabetic neuropathy, unspecified: Secondary | ICD-10-CM | POA: Diagnosis not present

## 2015-05-08 DIAGNOSIS — Z85828 Personal history of other malignant neoplasm of skin: Secondary | ICD-10-CM | POA: Diagnosis not present

## 2015-05-08 DIAGNOSIS — M4804 Spinal stenosis, thoracic region: Secondary | ICD-10-CM | POA: Insufficient documentation

## 2015-05-08 DIAGNOSIS — M5116 Intervertebral disc disorders with radiculopathy, lumbar region: Secondary | ICD-10-CM | POA: Insufficient documentation

## 2015-05-08 DIAGNOSIS — M5126 Other intervertebral disc displacement, lumbar region: Secondary | ICD-10-CM | POA: Diagnosis not present

## 2015-05-08 DIAGNOSIS — R32 Unspecified urinary incontinence: Secondary | ICD-10-CM | POA: Insufficient documentation

## 2015-05-08 NOTE — Progress Notes (Signed)
Subjective:    Patient ID: Cameron Carter, male    DOB: 11-11-1940, 75 y.o.   MRN: [id number]  HPI  The patient is a 75 year old gentleman who comes to pain management at the request of Dr. Iran Planas for further evaluation and treatment of pain involving the lower back and lower extremity regions. The patient is with history of spinal stenosis of the lumbar spine in is undergone evaluation by Dr. Velna Ochs and Dr. Hal Neer without plans for surgical intervention in addition to intraspinal abnormalities of the lumbar region the patient also is with obesity and has diabetes mellitus. There is being concern regarding patient's general medical condition in terms of increased risk for proceeding with surgery as well as interventional treatment to treat patient's pain. On today's visit we discussed patient's condition and informed patient that we preferred to have patient undergo evaluation at tertiary pain clinic and we'll refer patient to [facility] for evaluation and recommendations. We informed patient that we preferred to avoid epidural steroid injection or other procedures to treat his pain and that we would follow recommendations of a tertiary pain clinic and wished to proceed with scheduling patient for evaluation at tertiary pain clinic. The patient agreed to suggested treatment plan. The patient described his pain is aching agonizing annoying burning constant cooled deep disabling exhausting heavy horrible nagging pulsating punishing sharp shooting stabbing stated sickening including tingling throbbing pain awakening patient from sleep pain also interfered with patient ability to patient to go to sleep The patient stated the pain increased with bending bowel movements climbing intercourse kneeling lifting sitting standing squatting stooping twisting walking. The patient stated that nothing head relieve the pain. We informed patient that we would follow recommendations of tertiary pain  clinic and that we proceed with scheduling patient for evaluation at [facility] at this time. All agreed to suggested treatment plan   Review of Systems    Cardiovascular: Prior myocardial infarction Prior heart surgery Daily aspirin intake Prior heart catheterization these antibiotics prior to dental work   Pulmonary: Sleep apnea  Neurological: Urinary incontinence  Psychological: Unremarkable  Gastrointestinal: Unremarkable  Genitourinary: Unremarkable  Hematologic: Unremarkable  Endocrine: Diabetes mellitus  Rheumatological: Unremarkable  Musculoskeletal: Unremarkable  Other significant: Carcinoma skin      Objective:   Physical Exam  There was tenderness to palpation of paraspinal musculature region cervical region cervical facet region a moderate degree with moderate tenderness of the splenius capitis and occipitalis musculature regions. Palpation over the cervical facet cervical paraspinal must reason thoracic facet thoracic paraspinal musculature region was attends to palpation of moderate degree. The patient appeared to be with slightly decreased grip strength and Tinel and Phalen's maneuver were without increase of pain of significant degree. There was palpation over the thoracic facet thoracic paraspinal musculature region a moderate degree with no crepitus of the thoracic region noted. There was tenderness of the acromioclavicular and glenohumeral joint region a moderate degree and patient appeared to be unremarkable Spurling's maneuver. The patient appeared to be with slightly decreased grip strength and Tinel and Phalen's maneuver were without increase of pain palpation over the lumbar paraspinal must reason lumbar facet region associated with moderate discomfort with lateral bending rotation extension and palpation of the lumbar facets reproducing moderate discomfort. There was moderate tenderness of the PSIS and PII S region as well as the  greater trochanteric region iliotibial band region. Straight leg raising was tolerates approximately 30 without increased pain with dorsiflexion noted. EHL strength appeared to  be decreased. The lower extremities were with evidence of lesions of the lower extremity with slight erythema. There was negative clonus negative Homans. DTRs were difficult to elicit patient difficult relaxing. The abdomen was with no evidence of excessive tenderness to palpation to palpation and no costovertebral angle tenderness was noted.      Assessment & Plan:    DeNormal alignment. Negative for fracture or mass. Conus medullaris normal and terminates at L1-2  T12-L1: Right-sided disc protrusion extending into the foramen is unchanged. There is associated spurring and right foraminal encroachment. Bilateral facet hypertrophy. Mild to moderate spinal stenosis is unchanged.  L1-2: Disc bulging and endplate osteophyte formation. Bilateral facet hypertrophy. Moderate spinal stenosis. 3 x 6 mm synovial cyst on the left has progressed and is indenting the thecal sac and causing compression of the left L2 nerve root in the subarticular recess. Interval development of extruded disc fragment on the right with downgoing disc material compressing the right L2 nerve root. This was not present previously.  L2-3: Diffuse disc bulging with endplate osteophyte formation. Facet and ligamentum flavum hypertrophy. Moderate spinal stenosis unchanged. Moderate foraminal narrowing bilaterally  L3-4: Moderate to advanced disc degeneration with diffuse endplate osteophyte formation right greater than left. Moderate foraminal encroachment bilaterally. Moderate spinal stenosis unchanged.  L4-5: Advanced disc degeneration with marked disc space narrowing and fatty changes in the endplates. Diffuse endplate osteophyte formation and bilateral facet hypertrophy. Moderate spinal stenosis. Severe foraminal encroachment  bilaterally unchanged.  L5-S1: Advanced disc degeneration with probable bony fusion of the vertebral bodies. Diffuse endplate osteophyte formation causing moderate foraminal encroachment bilaterally. This is unchanged.  IMPRESSION: T12-L1 right-sided disc protrusion and spurring is unchanged from the prior study. There is mild to moderate spinal stenosis  L1-2 moderate spinal stenosis. Interval development of extruded disc fragment on the right with downgoing disc material compressing the right L2 nerve root. Progression of synovial cyst on the left compressing the left L2 nerve root.  Moderate spinal stenosis L2-3 is unchanged  Moderate spinal stenosis L3-4 unchanged with moderate foraminal encroachment bilaterally  Moderate spinal stenosis and severe foraminal encroachment bilaterally L4-5 unchanged.  Moderate foraminal encroachment bilaterally L5-S1 unchangedgenerative disc disease lumbar spine  Lumbar stenosis with neurogenic claudication  Lumbar radiculopathy  Lumbar facet syndrome  Diabetes mellitus with diabetic neuropathy  Obesity  Cardiovascular disease  Sleep apnea      PLAN  Continue present medication  F/U PCP Dr. Jeanann Lewandowsky  for evaliation of  BP diabetes mellitusand general medical  condition  F/U surgical evaluation. Patient will follow-up with Dr. Regenia Skeeter and Dr. Hal Neer as needed. Both surgeons preferred to avoid surgical intervention of patient  Ask the nurses and secretary the date of your appointment at [facility]  May consider radiofrequency rhizolysis or intraspinal procedures pending response to present treatment and F/U evaluation . We will avoid considering such procedures at this time and will proceed with scheduling patient for evaluation at [place]  Patient to call Pain Management Center should patient have concerns prior to scheduled return appointment.

## 2015-05-08 NOTE — Telephone Encounter (Signed)
Spoke to patient and gave him information provided by physician.

## 2015-05-08 NOTE — Telephone Encounter (Signed)
On March 22nd we discussed that would be a last one-time script for narcotic pain medicine.  This is not something we can prescribe long term.

## 2015-05-08 NOTE — Patient Instructions (Addendum)
PLAN  Continue present medication  F/U PCP Dr. Jeanann Lewandowsky  for evaliation of  BP diabetes mellitusand general medical  condition  F/U surgical evaluation. Patient will follow-up with Dr. Regenia Skeeter and Dr. Hal Neer as needed  F/U neurological evaluation. May consider pending follow-up evaluations  Ask the nurses and secretary the date of your appointment at [facility]  May consider radiofrequency rhizolysis or intraspinal procedures pending response to present treatment and F/U evaluation   Patient to call Pain Management Center should patient have concerns prior to scheduled return appointment.

## 2015-05-13 ENCOUNTER — Other Ambulatory Visit: Payer: Self-pay

## 2015-05-15 LAB — TOXASSURE SELECT 13 (MW), URINE: PDF: 0

## 2015-05-19 ENCOUNTER — Telehealth: Payer: Self-pay | Admitting: Pulmonary Disease

## 2015-05-19 NOTE — Telephone Encounter (Signed)
lmtcb X1 for [facility] with [facility].

## 2015-05-20 NOTE — Telephone Encounter (Signed)
lmtcb X2 for [facility] with [facility]

## 2015-05-21 NOTE — Telephone Encounter (Signed)
LMTCB

## 2015-05-23 ENCOUNTER — Telehealth: Payer: Self-pay | Admitting: Pulmonary Disease

## 2015-05-23 NOTE — Telephone Encounter (Signed)
LM for Magda Paganini that she can contact HIM at [phone] for status of records request.

## 2015-05-23 NOTE — Telephone Encounter (Signed)
lmtcb x4 for [place]. Message will be closed per triage protocol.

## 2015-05-27 ENCOUNTER — Encounter: Payer: Self-pay | Admitting: Pulmonary Disease

## 2015-05-27 ENCOUNTER — Ambulatory Visit (INDEPENDENT_AMBULATORY_CARE_PROVIDER_SITE_OTHER): Payer: Medicare Other | Admitting: Pulmonary Disease

## 2015-05-27 VITALS — BP 112/62 | HR 100 | Ht 68.0 in | Wt 272.0 lb

## 2015-05-27 DIAGNOSIS — G4733 Obstructive sleep apnea (adult) (pediatric): Secondary | ICD-10-CM | POA: Diagnosis not present

## 2015-05-27 NOTE — Addendum Note (Signed)
Addended by: Mathis Bud on: 05/27/2015 12:44 PM   Modules accepted: Orders

## 2015-05-27 NOTE — Progress Notes (Signed)
Subjective:    Patient ID: Cameron Carter, male    DOB: 11-25-1940, 75 y.o.   MRN: [id number]  HPI Patient reports he was diagnosed with OSA in 2010. He reports he is having trouble with the functioning of his machine. He has had the same machine since then.  Since then he has lost 70 pounds. Records indicate his weight was 290 pounds. He is using a full face mask. He reports he rests well with the machine. He does wake up having to urinate 2-3 times at night. He reports infrequent daytime napping. No dozing while driving or watching TV. Denies any dyspnea, cough or wheezing.   Review of Systems Denies any chest pressure. Reports intermittent chest pain that is chronic. Denies any syncope or near syncope. A pertinent 14 point review of systems is negative except as per the history of presenting illness.  Allergies  Allergen Reactions  . Atorvastatin     Other reaction(s): Muscle Pain    Current Outpatient Prescriptions on File Prior to Visit  Medication Sig Dispense Refill  . aspirin 81 MG tablet Take 81 mg by mouth daily.    . clopidogrel (PLAVIX) 75 MG tablet Take 75 mg by mouth daily.   2  . gabapentin (NEURONTIN) 300 MG capsule 300 mg 3 (three) times daily.   9  . glimepiride (AMARYL) 4 MG tablet Take 4 mg by mouth 2 (two) times daily.   98  . HUMALOG MIX 75/25 KWIKPEN (75-25) 100 UNIT/ML Kwikpen 60 units in the morning w/ breakfast and 15 units in the evening  98  . HYDROcodone-acetaminophen (NORCO) 5-325 MG tablet Take 1 tablet by mouth every 6 (six) hours as needed for moderate pain. 40 tablet 0  . isosorbide mononitrate (IMDUR) 30 MG 24 hr tablet Take 30 mg by mouth daily.   10  . niacin 500 MG tablet Take 500 mg by mouth daily.    . nitroGLYCERIN (NITROSTAT) 0.4 MG SL tablet Prn for CP as instructed.    . pravastatin (PRAVACHOL) 40 MG tablet Take 40 mg by mouth daily.   2  . vitamin B-12 (CYANOCOBALAMIN) 1000 MCG tablet Take 1,000 mcg by mouth daily.     No current  facility-administered medications on file prior to visit.    Past Medical History  Diagnosis Date  . Arthritis   . Cancer ([place])     skin (chest, back, arms, ear)  . Diabetes mellitus without complication ([place])   . Sleep apnea   . Hypertension   . Kidney stones   . Myocardial infarction [facility])     Past Surgical History  Procedure Laterality Date  . Tonsillectomy and adenoidectomy  age 64  . Shoulder arthroscopy w/ rotator cuff repair Left 2013  . Cardiac catheterization      3 separate times; stents placed  . Tumor excision      skin    Family History  Problem Relation Age of Onset  . Adopted: Yes  . Family history unknown: Yes    Social History   Social History  . Marital Status: Married    Spouse Name: N/A  . Number of Children: N/A  . Years of Education: N/A   Social History Main Topics  . Smoking status: Never Smoker   . Smokeless tobacco: Never Used  . Alcohol Use: No  . Drug Use: No  . Sexual Activity: Not Asked   Other Topics Concern  . None   Social History Narrative   Married, lives  with spouse and dachsund   2 daughters, 1 grandson   Retired Chief of Staff x34yrs   No recent travel   Pt is adopted      Objective:   Physical Exam BP 112/62 mmHg  Pulse 100  Ht 5\' 8"  (1.727 m)  Wt 272 lb (123.378 kg)  BMI 41.37 kg/m2  SpO2 94% General:  Awake. Alert. No acute distress. Morbidly obese. Integument:  Warm & dry. No rash on exposed skin. No bruising on exposed skin. Lymphatics:  No appreciated cervical or supraclavicular lymphadenoapthy. HEENT:  Moist mucus membranes. No oral ulcers. No scleral injection or icterus. Mallampati Class 3.  Cardiovascular:  Regular rate. No edema. No appreciable JVD.  Pulmonary:  Good aeration & clear to auscultation bilaterally. Symmetric chest wall expansion. No accessory muscle use. Abdomen: Soft. Normal bowel sounds. Protuberant. Grossly nontender. Musculoskeletal:  Normal bulk and tone. Hand grip strength 5/5  bilaterally. No joint deformity or effusion appreciated. Neurological:  CN 2-12 grossly in tact. No meningismus. Moving all 4 extremities equally. Symmetric brachioradialis deep tendon reflexes. Psychiatric:  Mood and affect congruent. Speech normal rhythm, rate & tone.   ESS Today: 4.  PSG/CPAP Study (03/29/08):  Weight 290 pounds. Overall AHI 102.9 events/hr. Nadir Sat 73%. CPAP 16 cm H2O pressure w/ nadir 90% on room air. Residual CPAP AHI 85.7 events/hr. BiPAP at 21/17 w/ residual AHI 0 events/hr.     Assessment & Plan:  75 year old male with morbid obesity and previously diagnosed severe obstructive sleep apnea requiring BiPAP therapy. I reviewed his polysomnogram/split-night study from 2010. His weight at that time was in excess of what is recorded on his study. He reports he has lost approximately 70 pounds since then. He reports excellent quality of sleep on BiPAP therapy but is having problems with his machine malfunctioning. I believe that with his reported weight loss and the time since his last study a repeat split-night sleep study is warranted to ensure he is on the appropriate pressure. I instructed the patient contact my office if he had any new problems before his next appointment.  1. Severe OSA: Ordering split-night sleep study with CPAP/BiPAP titration. Patient will continue using his machine as long as a function for now. 2. Follow-up: Patient to return to clinic in 3 months or sooner if needed.  Sonia Baller Ashok Cordia, M.D. [facility] Pager:  [phone] After 3pm or if no response, call [phone] 12:42 PM 05/27/2015

## 2015-05-27 NOTE — Patient Instructions (Signed)
   We will set you up for a split night sleep study and get you a new machine once this is complete  Call me if you have any questions or problems  I will see you back in 3 months  TESTS ORDERED: 1. Split night sleep study w/ CPAP/BiPAP titration

## 2015-06-03 ENCOUNTER — Ambulatory Visit: Payer: Medicare Other | Attending: Pulmonary Disease

## 2015-06-03 DIAGNOSIS — G2581 Restless legs syndrome: Secondary | ICD-10-CM | POA: Diagnosis not present

## 2015-06-03 DIAGNOSIS — R0683 Snoring: Secondary | ICD-10-CM | POA: Insufficient documentation

## 2015-06-03 DIAGNOSIS — G4733 Obstructive sleep apnea (adult) (pediatric): Secondary | ICD-10-CM | POA: Diagnosis not present

## 2015-06-18 ENCOUNTER — Telehealth: Payer: Self-pay | Admitting: Pulmonary Disease

## 2015-06-18 DIAGNOSIS — G4733 Obstructive sleep apnea (adult) (pediatric): Secondary | ICD-10-CM

## 2015-06-18 NOTE — Telephone Encounter (Signed)
I had a conversation with the patient regarding the results of his sleep study. I informed him that his BiPAP settings will not be changing significantly. I also informed him that I signed the paperwork for his new BiPAP machine. I also informed him that I have spoken with office staff and the paperwork will be sent to his DME company and I cannot say how long it will take for them to respond with a new machine. He also expressed displeasure with the amount of time he had to wait before seeing me at his last appointment.  He also felt that he should have been contacted with the results of his sleep study and thought that even though he had his testing on 5/13 it took too long to get the result and is afraid he will pass away in his sleep. He informed me that while his machine is not working 100% normally it is still working and he is still using it "religiously". I apologized for his wait again as I did the day I saw him and offered to have him follow-up with our [place] office in an effort to make his appointment location and timing more convenient for him. He then informed me that he will likely be switching over to [facility] for care of his sleep apnea. I again apologized and asked that he contact me if there was anything else I could do for him.

## 2015-06-18 NOTE — Telephone Encounter (Signed)
Spoke with pt. He was very angry and condensing. Pt had a sleep study done on 05/31/15. He has not heard about his results and is needing a new CPAP machine. Pt kept make comments about contacting a lawyer for a wrongful death suit if he died during his sleep if his CPAP stopped working. Stated, "I don't trust your clinic. I was made to await 3 hours at my last appointment before I saw the doctor." Pt also stated that if we do not contact him back about his results, he will be contacting the medical board on [place].  JN - please advise on pt's sleep study results and please advise on CPAP order. Thanks.

## 2015-06-18 NOTE — Telephone Encounter (Signed)
The patient is anxious and upset that his machine may quit working during the night and even talked about contacting a lawyer about wrongful death if he were to stop breathing during the night.  Please contact patient as soon as possible.

## 2015-06-18 NOTE — Telephone Encounter (Signed)
Order placed for new bipap and will be sent by [facility] today.

## 2015-08-19 DEATH — deceased

## 2015-08-27 ENCOUNTER — Ambulatory Visit: Payer: Medicare Other | Admitting: Pulmonary Disease

## 2016-07-30 IMAGING — CT CT HEAD W/O CM
1 of 2 series · 14 of 30 positions shown, 18 images · non-contrast
Comparison: None.

CLINICAL DATA: Head laceration after falling backwards out of chair
at home.

EXAM:
CT HEAD WITHOUT CONTRAST
CT CERVICAL SPINE WITHOUT CONTRAST
TECHNIQUE: Multidetector CT imaging of the head and cervical spine was
performed following the standard protocol without intravenous
contrast. Multiplanar CT image reconstructions of the cervical spine
were also generated.

[Series 6: orthogonal axials · axial · 0.32mm/px · z∈[+304,+460]mm · 14 of 101 slices shown, 18 images]
[im 7/101  brain]
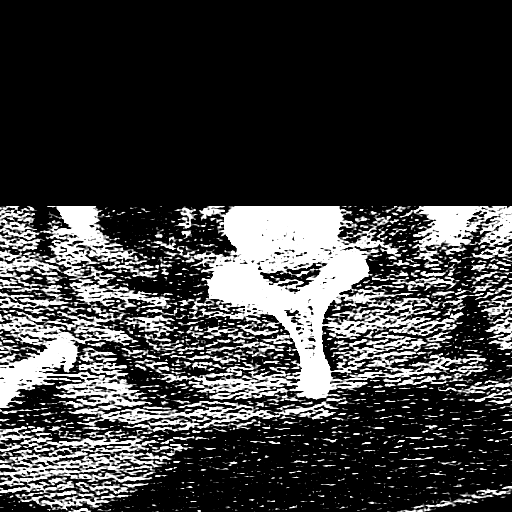
[im 7/101  bone]
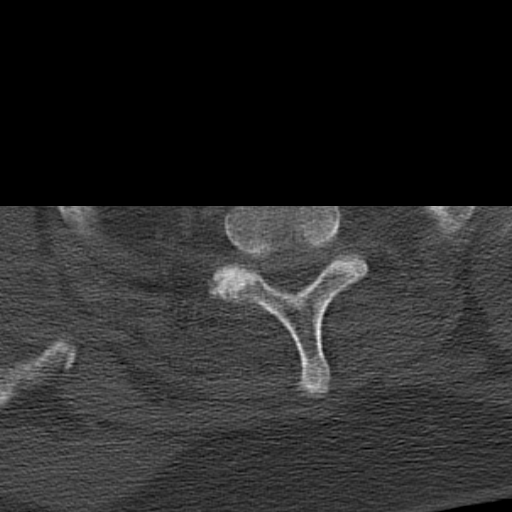
[im 14/101  brain]
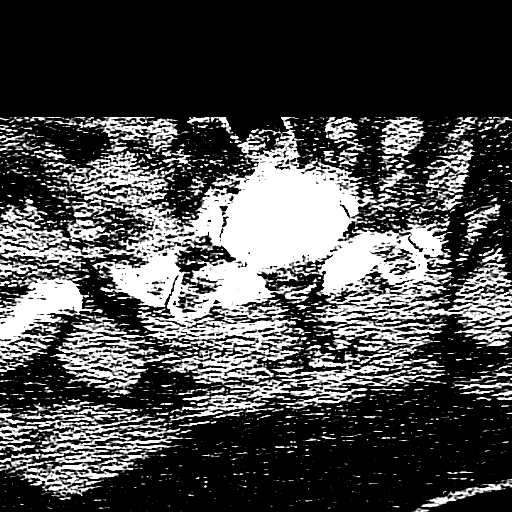
[im 21/101  brain]
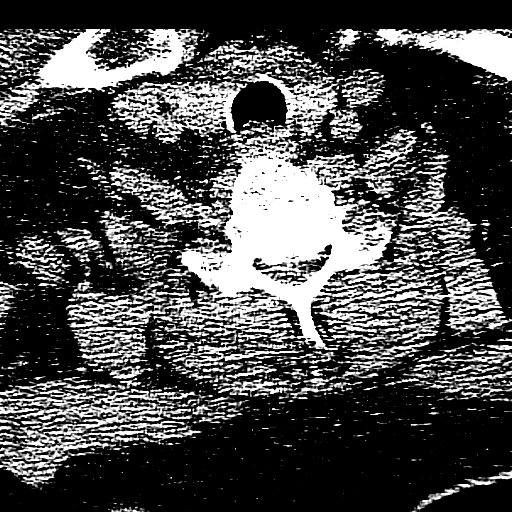
[im 27/101  brain]
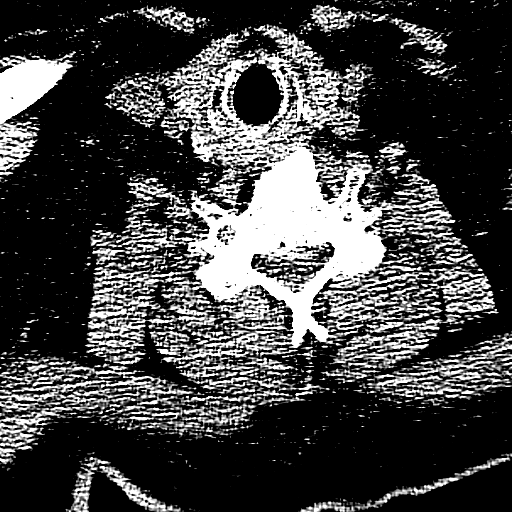
[im 34/101  brain]
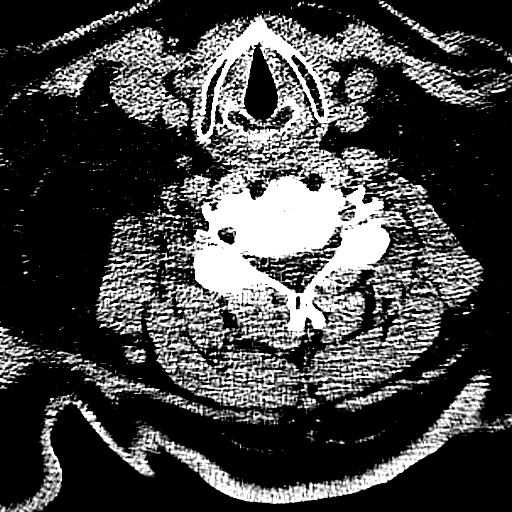
[im 34/101  bone]
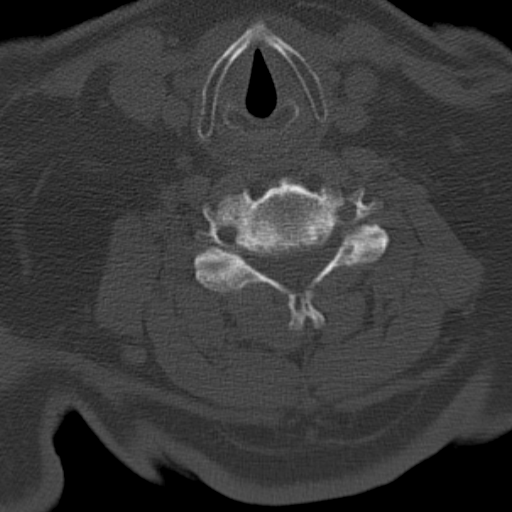
[im 41/101  brain]
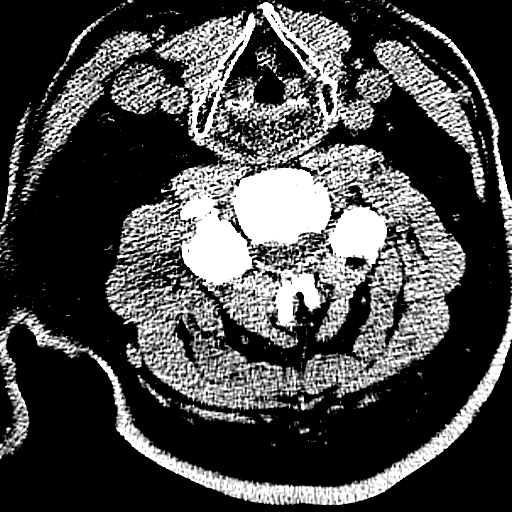
[im 47/101  brain]
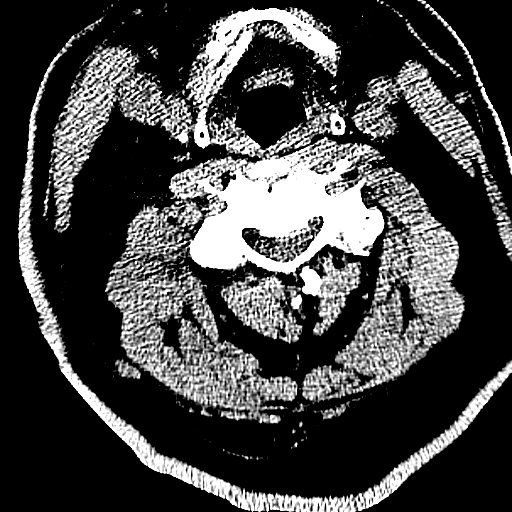
[im 54/101  brain]
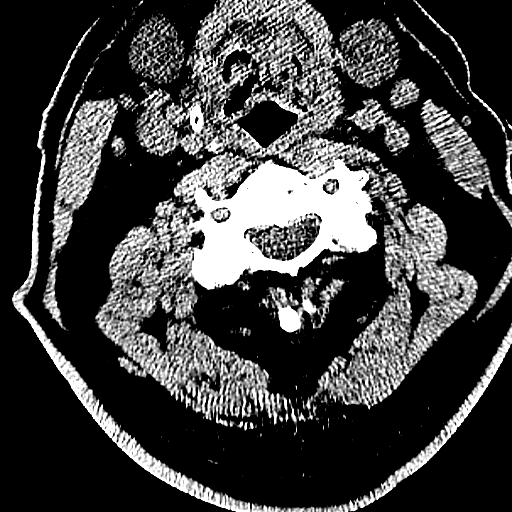
[im 61/101  brain]
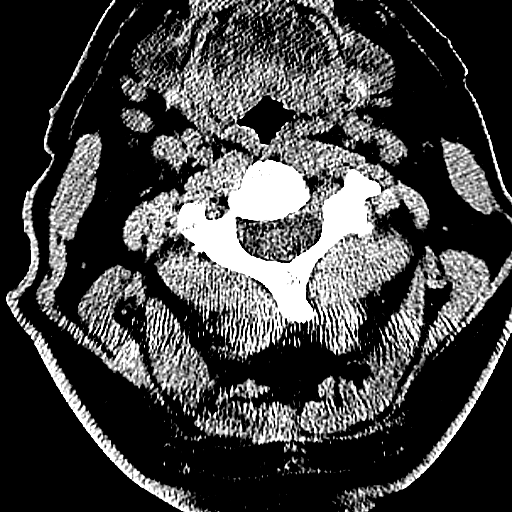
[im 61/101  bone]
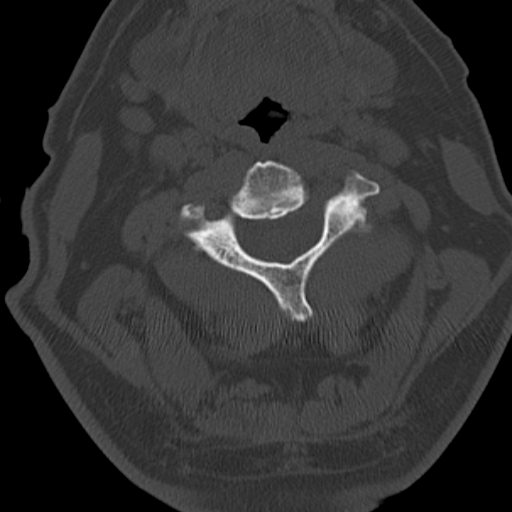
[im 67/101  brain]
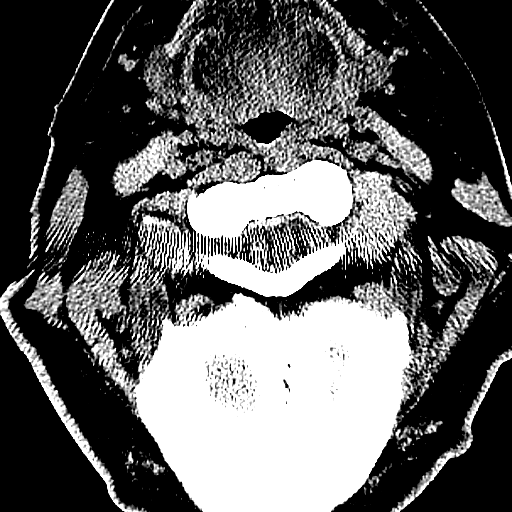
[im 74/101  brain]
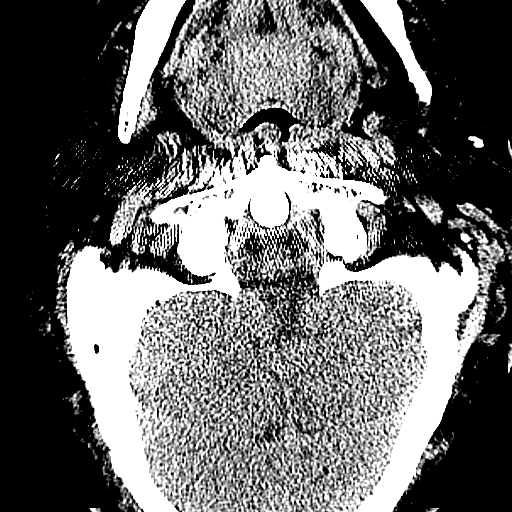
[im 81/101  brain]
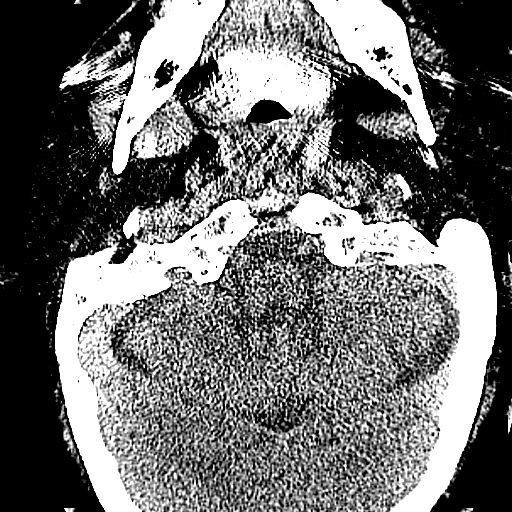
[im 87/101  brain]
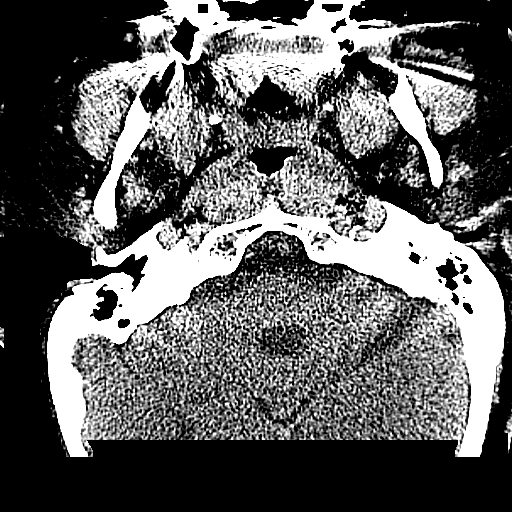
[im 87/101  bone]
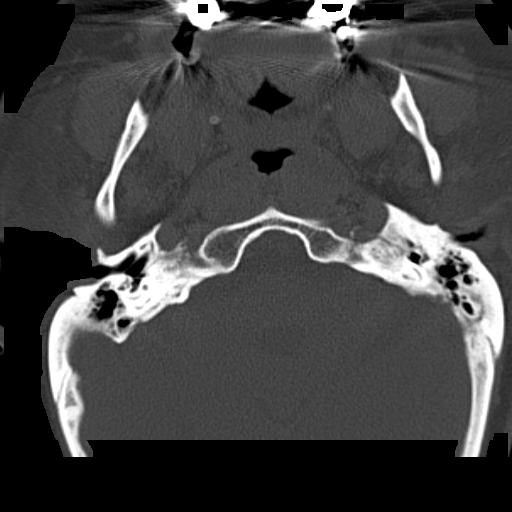
[im 94/101  brain]
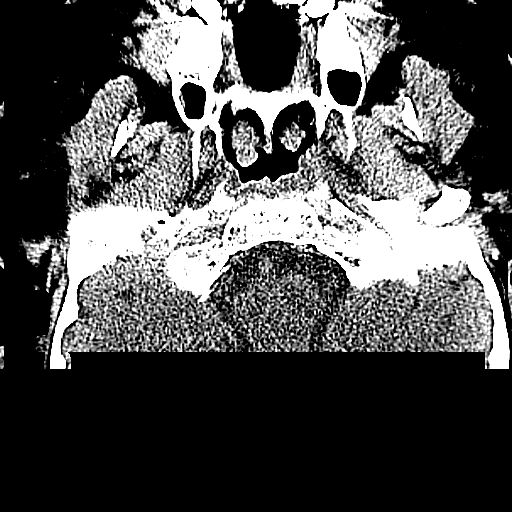

[14 of 30 positions shown; findings below may reference images not displayed]

FINDINGS: CT HEAD FINDINGS

Bony calvarium appears intact. Mild chronic ischemic white matter
disease is noted. No mass effect or midline shift is noted.
Ventricular size is within normal limits. There is no evidence of
mass lesion, hemorrhage or acute infarction. Surgical staples and
associated scalp hematoma are seen posteriorly.

CT CERVICAL SPINE FINDINGS

No fracture or spondylolisthesis is noted. Severe degenerative disc
disease is noted at C5-6, C6-7 and C7-T1 with anterior osteophyte
formation. Mild degenerative disc disease is also noted at C3-4 with
posterior osteophyte formation. Mild degenerative changes seen
involving the posterior facet joints of the upper cervical spine.
IMPRESSION: Mild chronic ischemic white matter disease. Small posterior scalp
hematoma with overlying surgical staples is noted. No acute
intracranial abnormality seen.

Severe multilevel degenerative disc disease is noted in the cervical
spine. No fracture or spondylolisthesis is noted.

## 2016-07-30 IMAGING — CR DG THORACIC SPINE 2V
3 series · 4 of 4 positions shown · non-contrast
Comparison: February 27, 2008.

CLINICAL DATA: Acute upper back pain after fall out of chair.
Initial encounter.

EXAM:
THORACIC SPINE - 2 VIEW

[t-spine ap]
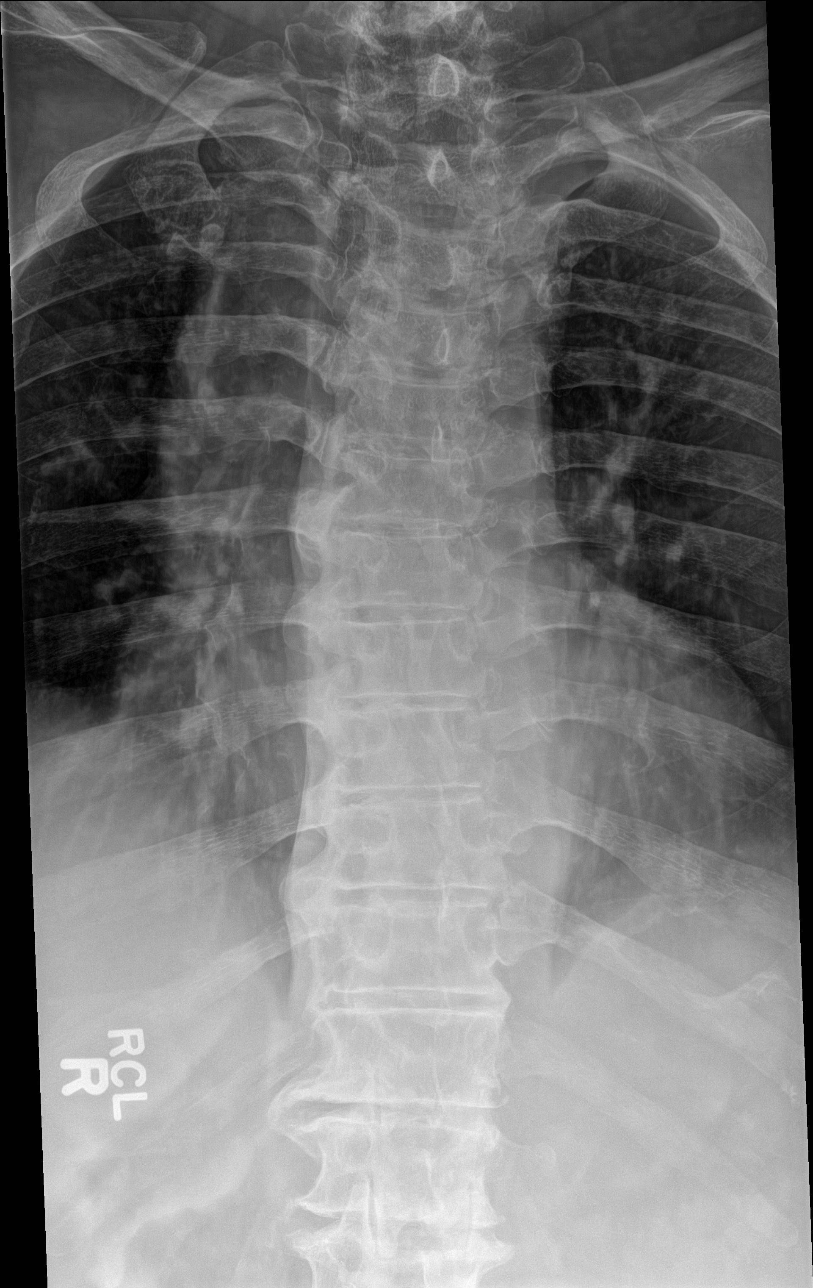

[t-spine lat]
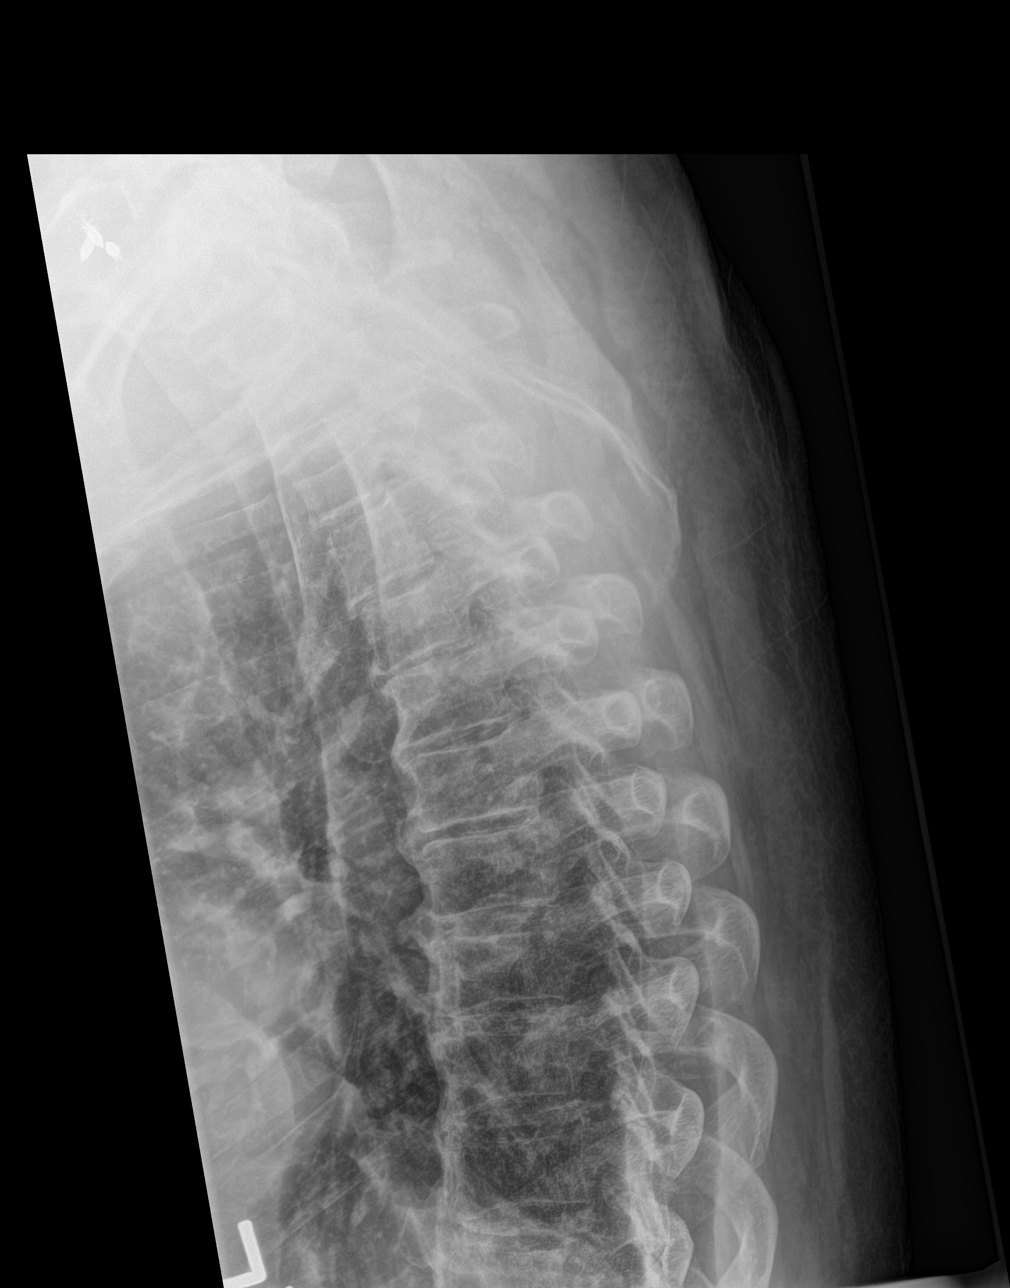

[Series 3: t-spine swimmers · 0.14mm/px · 2 of 2 slices shown]
[im 1/2]
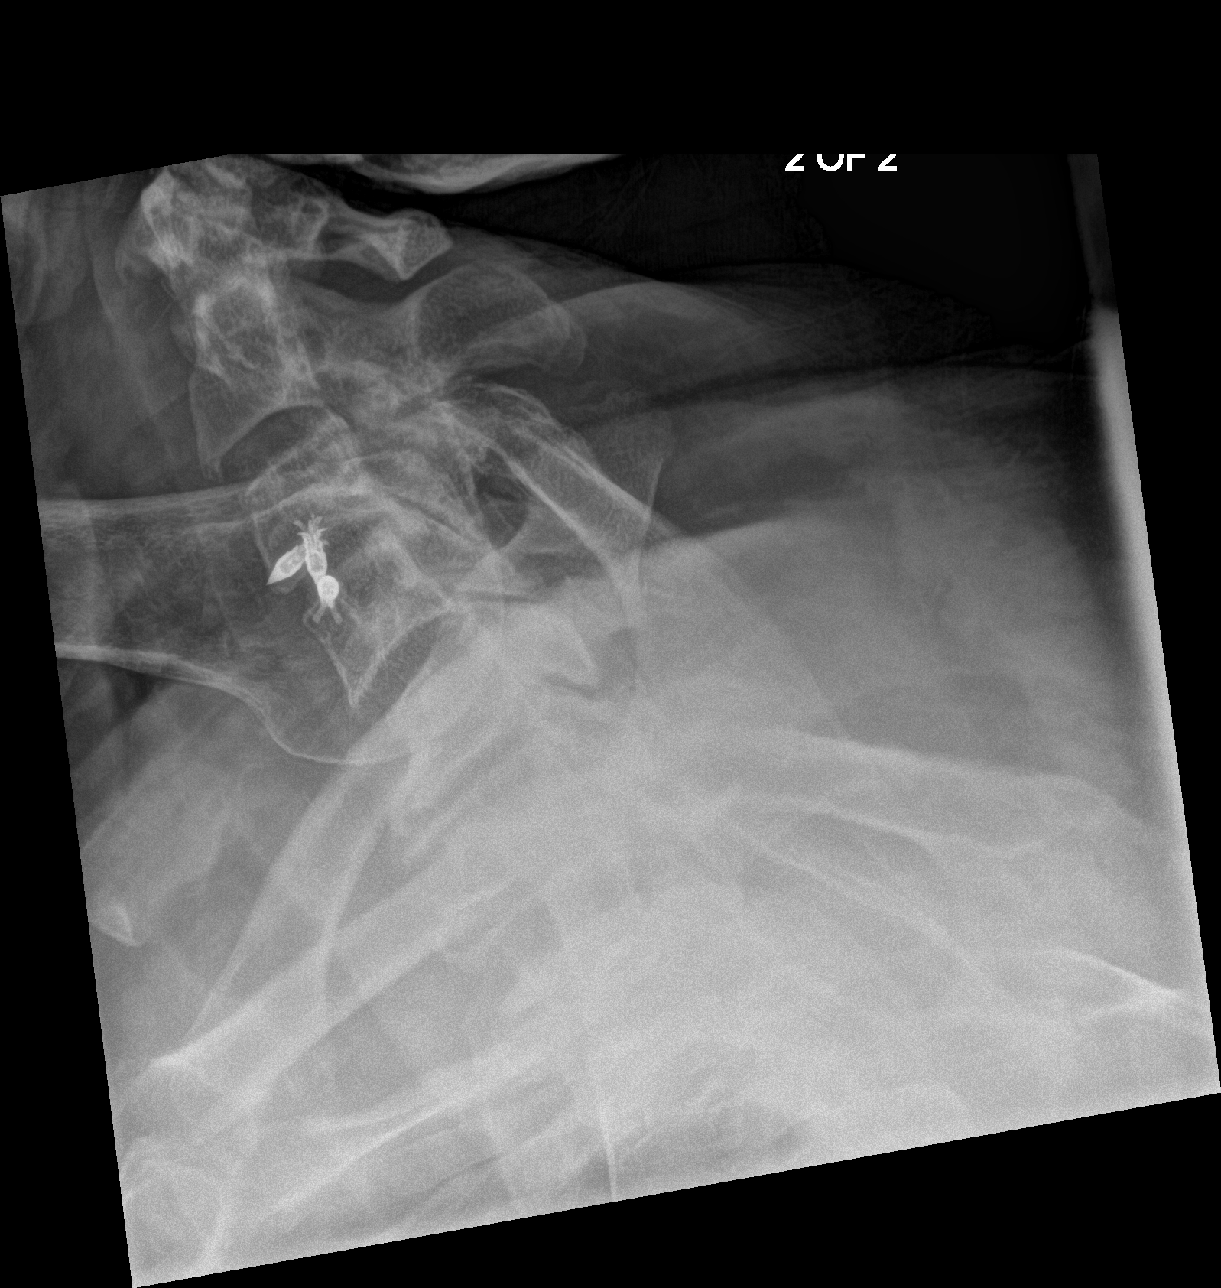
[im 2/2]
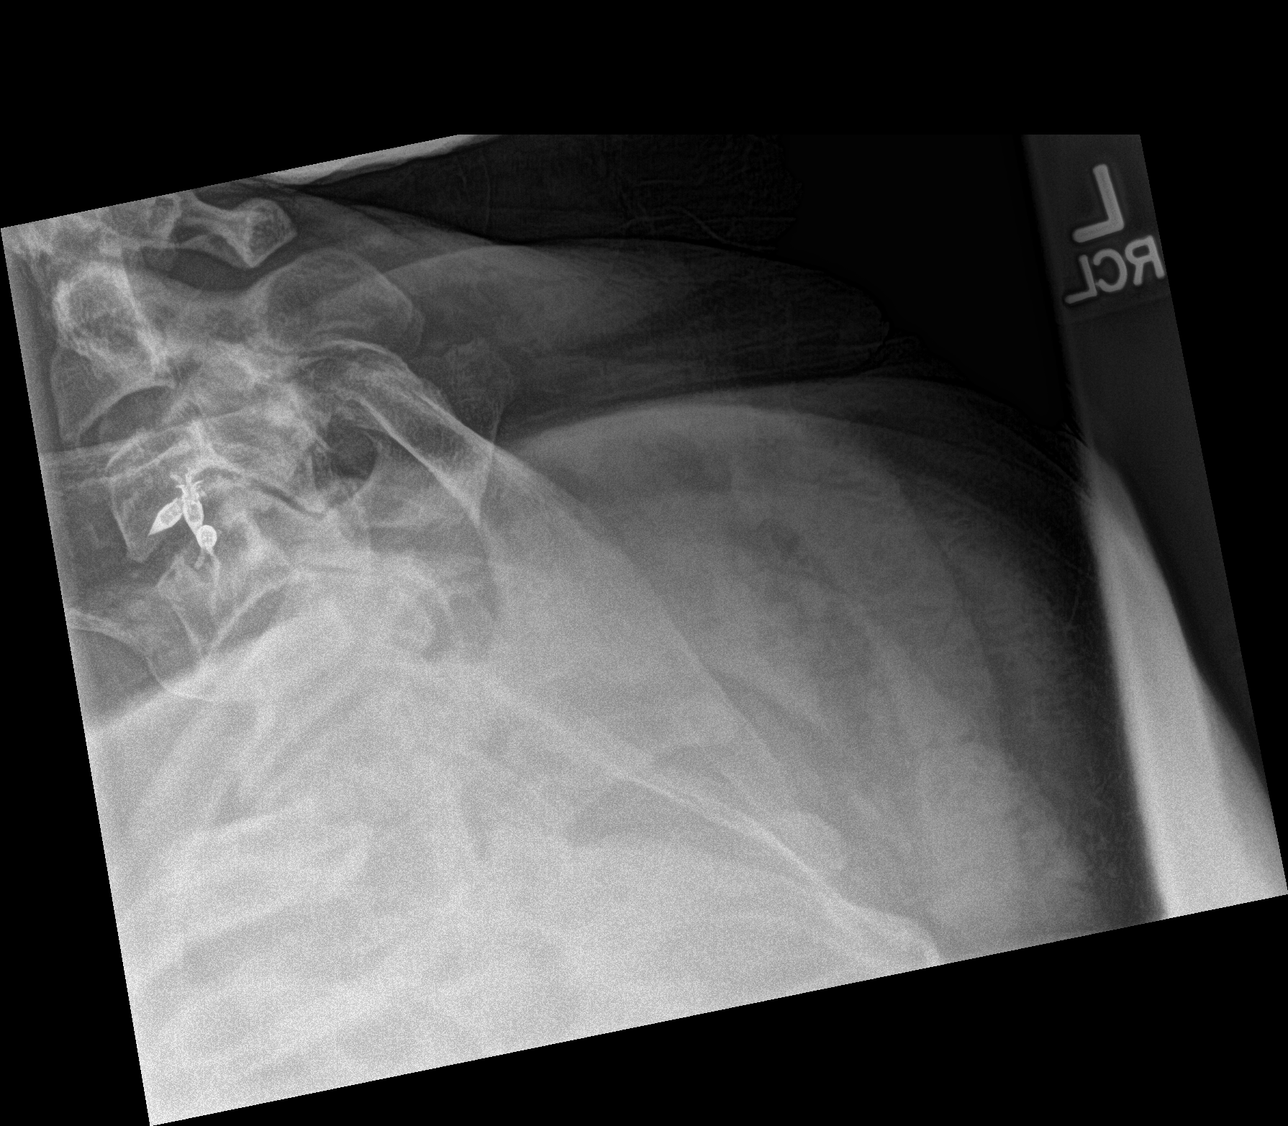

[4 of 4 positions shown; findings below may reference images not displayed]

FINDINGS: No fracture or spondylolisthesis is noted. Anterior osteophyte
formation is noted in the middle and lower aspects of the thoracic
spine.
IMPRESSION: No acute abnormality seen in the thoracic spine.
